# Patient Record
Sex: Male | Born: 1940 | Race: White | Hispanic: No | Smoking: Former smoker
Health system: Southern US, Community
[De-identification: ages and names within clinical notes are randomized; demographics above are authoritative.]

## PROBLEM LIST (undated history)

## (undated) DIAGNOSIS — N179 Acute kidney failure, unspecified: Secondary | ICD-10-CM

## (undated) DIAGNOSIS — I4891 Unspecified atrial fibrillation: Secondary | ICD-10-CM

## (undated) DIAGNOSIS — I499 Cardiac arrhythmia, unspecified: Secondary | ICD-10-CM

## (undated) DIAGNOSIS — N183 Chronic kidney disease, stage 3 unspecified: Secondary | ICD-10-CM

## (undated) DIAGNOSIS — H269 Unspecified cataract: Secondary | ICD-10-CM

## (undated) DIAGNOSIS — I10 Essential (primary) hypertension: Secondary | ICD-10-CM

## (undated) HISTORY — PX: CARDIOVERSION: SHX1299

---
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
## 2016-04-28 ENCOUNTER — Encounter (HOSPITAL_COMMUNITY): Payer: Self-pay | Admitting: *Deleted

## 2016-04-28 ENCOUNTER — Emergency Department (HOSPITAL_COMMUNITY): Payer: Medicare Other

## 2016-04-28 ENCOUNTER — Emergency Department (HOSPITAL_COMMUNITY)
Admission: EM | Admit: 2016-04-28 | Discharge: 2016-04-28 | Disposition: A | Discharge status: Home / Self Care | Payer: Medicare Other | Attending: Emergency Medicine | Admitting: Emergency Medicine

## 2016-04-28 DIAGNOSIS — I480 Paroxysmal atrial fibrillation: Secondary | ICD-10-CM | POA: Diagnosis not present

## 2016-04-28 DIAGNOSIS — R000 Tachycardia, unspecified: Secondary | ICD-10-CM | POA: Diagnosis present

## 2016-04-28 LAB — CBC
HCT: 48.6 % (ref 39.0–52.0)
Hemoglobin: 16.9 g/dL (ref 13.0–17.0)
MCH: 31.9 pg (ref 26.0–34.0)
MCHC: 34.8 g/dL (ref 30.0–36.0)
MCV: 91.9 fL (ref 78.0–100.0)
PLATELETS: 203 10*3/uL (ref 150–400)
RBC: 5.29 MIL/uL (ref 4.22–5.81)
RDW: 12.8 % (ref 11.5–15.5)
WBC: 6.4 10*3/uL (ref 4.0–10.5)

## 2016-04-28 LAB — COMPREHENSIVE METABOLIC PANEL
ALBUMIN: 3.8 g/dL (ref 3.5–5.0)
ALT: 24 U/L (ref 17–63)
ANION GAP: 8 (ref 5–15)
AST: 27 U/L (ref 15–41)
Alkaline Phosphatase: 85 U/L (ref 38–126)
BILIRUBIN TOTAL: 0.9 mg/dL (ref 0.3–1.2)
BUN: 17 mg/dL (ref 6–20)
CHLORIDE: 103 mmol/L (ref 101–111)
CO2: 24 mmol/L (ref 22–32)
Calcium: 8.7 mg/dL — ABNORMAL LOW (ref 8.9–10.3)
Creatinine, Ser: 1.18 mg/dL (ref 0.61–1.24)
GFR calc Af Amer: >60 mL/min (ref >60)
GFR, EST NON AFRICAN AMERICAN: 59 mL/min — AB (ref >60)
Glucose, Bld: 94 mg/dL (ref 65–99)
POTASSIUM: 4.1 mmol/L (ref 3.5–5.1)
Sodium: 135 mmol/L (ref 135–145)
TOTAL PROTEIN: 6.8 g/dL (ref 6.5–8.1)

## 2016-04-28 LAB — TROPONIN I: Troponin I: <0.03 ng/mL

## 2016-04-28 MED ORDER — ETOMIDATE 2 MG/ML IV SOLN
INTRAVENOUS | Status: AC | PRN
  Administered 2016-04-28: 15 mg via INTRAVENOUS

## 2016-04-28 MED ORDER — DILTIAZEM LOAD VIA INFUSION
20.0000 mg | Freq: Once | INTRAVENOUS | Status: AC
  Administered 2016-04-28: 20 mg via INTRAVENOUS
  Filled 2016-04-28: qty 20

## 2016-04-28 MED ORDER — RIVAROXABAN (XARELTO) VTE STARTER PACK (15 & 20 MG)
ORAL_TABLET | ORAL | 0 refills | Status: DC
Start: 2016-04-28 — End: 2016-05-03

## 2016-04-28 MED ORDER — ETOMIDATE 2 MG/ML IV SOLN
15.0000 mg | Freq: Once | INTRAVENOUS | Status: DC
  Filled 2016-04-28: qty 10

## 2016-04-28 MED ORDER — DEXTROSE 5 % IV SOLN
5.0000 mg/h | INTRAVENOUS | Status: DC
  Administered 2016-04-28: 5 mg/h via INTRAVENOUS
  Filled 2016-04-28: qty 100

## 2016-04-28 NOTE — ED Triage Notes (Signed)
Pt arrives from home via [facility]. Pt states he did a lot of work around his yard yesterday and upon going inside felt jittery. Pt states the last time this happened he had an infection around his heart. Pt went to his PCP today rt tachycardia and feeling "off" and they sent him EMS rt EKG changes.

## 2016-04-28 NOTE — ED Provider Notes (Signed)
[facility] Provider Note   CSN: [id number] Arrival date & time: 04/28/16  1026  First Provider Contact:  First MD Initiated Contact with Patient 04/28/16 1038        History   Chief Complaint Chief Complaint  Patient presents with  . Tachycardia    HPI Todd Bright is a 75 y.o. male.  HPI Patient presents emergency department with complaints of elevated heart rate since yesterday.  He first notices elevated heart rate when he was working in the yard but with rest it still did not improve.  He awoke this morning and still felt himself tachycardic to 120 so he went to his primary care doctor who noticed him to be in atrial fibrillation and sent him to the ER.  Patient states she's had one other episode of atrial fibrillation.  He denies chest pain and chest tightness at this time.  No shortness of breath.  No lightheadedness or weakness.  No recent illness.  No fevers or chills.  No productive cough.  Patient's been eating and drinking normally.   History reviewed. No pertinent past medical history.  There are no active problems to display for this patient.   History reviewed. No pertinent surgical history.     Home Medications    Prior to Admission medications   Not on File    Family History History reviewed. No pertinent family history.  Social History Social History  Substance Use Topics  . Smoking status: Unknown If Ever Smoked  . Smokeless tobacco: Not on file  . Alcohol use No     Allergies   Review of patient's allergies indicates no known allergies.   Review of Systems Review of Systems  All other systems reviewed and are negative.    Physical Exam Updated Vital Signs BP (!) 145/104   Pulse (!) 49   Temp 98.2 F (36.8 C) (Oral)   Resp 19   SpO2 98%   Physical Exam  Constitutional: He is oriented to person, place, and time. He appears well-developed and well-nourished.  HENT:  Head: Normocephalic and atraumatic.  Eyes: EOM are  normal.  Neck: Normal range of motion.  Cardiovascular: Intact distal pulses.   Tachycardiac.  Irregularly irregular  Pulmonary/Chest: Effort normal and breath sounds normal. No respiratory distress.  Abdominal: Soft. He exhibits no distension. There is no tenderness.  Musculoskeletal: Normal range of motion.  Neurological: He is alert and oriented to person, place, and time.  Skin: Skin is warm and dry.  Psychiatric: He has a normal mood and affect. Judgment normal.  Nursing note and vitals reviewed.    ED Treatments / Results  Labs (all labs ordered are listed, but only abnormal results are displayed) Labs Reviewed  COMPREHENSIVE METABOLIC PANEL - Abnormal; Notable for the following:       Result Value   Calcium 8.7 (*)    GFR calc non Af Amer 59 (*)    All other components within normal limits  CBC  TROPONIN I    EKG  EKG Interpretation  Date/Time:  Thursday April 28 2016 10:29:47 EDT Ventricular Rate:  124 PR Interval:    QRS Duration: 127 QT Interval:  293 QTC Calculation: 407 R Axis:   75 Text Interpretation:  Atrial fibrillation IVCD, consider atypical RBBB ST elevation suggests acute pericarditis No old tracing to compare Confirmed by Jamail Cullers  MD, Brittany Amirault ([id number]) on 04/28/2016 11:39:30 AM       Radiology No results found.  Procedures .Cardioversion Performed by: Patria Mane,  Rashelle Ireland Authorized by: Azalia Bilis   Consent:    Consent obtained:  Verbal   Consent given by:  Patient and spouse   Risks discussed:  Death, induced arrhythmia and pain   Alternatives discussed:  Rate-control medication and observation Pre-procedure details:    Cardioversion basis:  Elective   Rhythm:  Atrial fibrillation Attempt one:    Cardioversion mode:  Synchronous   Waveform:  Biphasic   Shock (Joules):  150   Shock outcome:  Conversion to normal sinus rhythm Post-procedure details:    Patient status:  Awake   Patient tolerance of procedure:  Tolerated well, no immediate  complications    (including critical care time)  Medications Ordered in ED Medications  diltiazem (CARDIZEM) 1 mg/mL load via infusion 20 mg (20 mg Intravenous Bolus from Bag 04/28/16 1151)    And  diltiazem (CARDIZEM) 100 mg in dextrose 5 % 100 mL (1 mg/mL) infusion (5 mg/hr Intravenous New Bag/Given 04/28/16 1150)  etomidate (AMIDATE) injection 15 mg (not administered)  etomidate (AMIDATE) injection (15 mg Intravenous Given 04/28/16 1432)      Initial Impression / Assessment and Plan / ED Course  I have reviewed the triage vital signs and the nursing notes.  Pertinent labs & imaging results that were available during my care of the patient were reviewed by me and considered in my medical decision making (see chart for details).  Clinical Course   CHADS-VASc = 2 (will initiate anticoagulation)  Attempted chemical cardioversion in the emergency department given onset last night.  Unable to achieve normal sinus rhythm after chemical cardioversion.  Thus synchronized cardioversion performed at the bedside.  Patient returned to normal sinus rhythm.  He is monitored in the emergency department for another hour and will follow-up in the A. fib clinic.  Final Clinical Impressions(s) / ED Diagnoses   Final diagnoses:  Paroxysmal a-fib [facility])    New Prescriptions Discharge Medication List as of 04/28/2016  3:57 PM    START taking these medications   Details  Rivaroxaban 15 & 20 MG TBPK Take as directed on package: Start with one 15mg  tablet by mouth twice a day with food. On Day 22, switch to one 20mg  tablet once a day with food., Print        Azalia Bilis, MD 05/02/16 [phone]

## 2016-04-28 NOTE — Sedation Documentation (Signed)
Pt given 10mg  of etomidate.

## 2016-04-29 ENCOUNTER — Telehealth (HOSPITAL_COMMUNITY): Payer: Self-pay | Admitting: *Deleted

## 2016-04-29 NOTE — Telephone Encounter (Signed)
Appt made for follow up ER visit this morning. Upon reviewing chart patient was sent home with Xarelto starter pack. Patient was instructed to start taking 20mg  once daily instead of the 15mg  twice a day until seen on Tuesday @ 3pm. Patient was very nervous during phone conversation but verbalized understanding to take the 20mg  once a day instead of the 15mg  tablets.  Pt states he feels much better than yesterday but is very nervous about everything. Offered patient appointment today but patient stated he could not come back to [place] today due to traffic. Call back information given and instructed patient to let me know if he had any issues getting RX filled for Xarelto.

## 2016-05-03 ENCOUNTER — Encounter (HOSPITAL_COMMUNITY): Payer: Self-pay | Admitting: Nurse Practitioner

## 2016-05-03 ENCOUNTER — Ambulatory Visit (HOSPITAL_COMMUNITY)
Admission: RE | Admit: 2016-05-03 | Discharge: 2016-05-03 | Disposition: A | Discharge status: Home / Self Care | Payer: Medicare Other | Source: Ambulatory Visit | Attending: Nurse Practitioner | Admitting: Nurse Practitioner

## 2016-05-03 VITALS — BP 172/94 | HR 91 | Ht 72.0 in | Wt 168.8 lb

## 2016-05-03 DIAGNOSIS — Z79899 Other long term (current) drug therapy: Secondary | ICD-10-CM | POA: Diagnosis not present

## 2016-05-03 DIAGNOSIS — I4891 Unspecified atrial fibrillation: Secondary | ICD-10-CM | POA: Diagnosis present

## 2016-05-03 DIAGNOSIS — Z809 Family history of malignant neoplasm, unspecified: Secondary | ICD-10-CM | POA: Insufficient documentation

## 2016-05-03 DIAGNOSIS — I480 Paroxysmal atrial fibrillation: Secondary | ICD-10-CM | POA: Insufficient documentation

## 2016-05-03 DIAGNOSIS — I10 Essential (primary) hypertension: Secondary | ICD-10-CM | POA: Diagnosis not present

## 2016-05-03 DIAGNOSIS — Z7901 Long term (current) use of anticoagulants: Secondary | ICD-10-CM | POA: Diagnosis not present

## 2016-05-03 DIAGNOSIS — Z8249 Family history of ischemic heart disease and other diseases of the circulatory system: Secondary | ICD-10-CM | POA: Diagnosis not present

## 2016-05-03 HISTORY — DX: Essential (primary) hypertension: I10

## 2016-05-03 HISTORY — DX: Cardiac arrhythmia, unspecified: I49.9

## 2016-05-03 MED ORDER — DILTIAZEM HCL 30 MG PO TABS: ORAL_TABLET | ORAL | 3 refills | Status: DC

## 2016-05-03 MED ORDER — RIVAROXABAN 20 MG PO TABS: 20.0000 mg | ORAL_TABLET | Freq: Every day | ORAL | 6 refills | Status: DC

## 2016-05-03 NOTE — Progress Notes (Signed)
Patient ID: Todd Bright, male   DOB: [birth year], 75 y.o.   MRN: [id number]     Primary Care Physician: Sherrie Mustache, MD Referring Physician: ER f/u   Todd Bright is a 75 y.o. male with a h/o new onset afib that presented to the [facility] ER 7/27 for fast heart rate that started the day before when pt was working out in the heat working pulling weeds and cutting down tall grass. He was successfully cardioverted. He also drank a 12 oz Dr. Malachi Bonds that afternoon, which he normally does not drink caffeine. He also quit smoking a week or two before and also had received news of his grandson's death a short time in the last few weeks He was cardioverted in the ER and started on xarelto. He does recall a similar episode in the past but it was not sustained. Chadsvasc score of 3. He states that he has "white coat hypertension" and his BP is normal. He does appear very anxious today. He does not drink alcohol and denies snoring history. No bleeding issues with xarelto on board. He was given xarelto starter kit, but was called at home and placed on correct dose of 20 mg a day. He also states was started on BB in the past by PCP for HTN but "couldn't put one foot in front of another" and it was stopped.  Today, he denies symptoms of palpitations, chest pain, shortness of breath, orthopnea, PND, lower extremity edema, dizziness, presyncope, syncope, or neurologic sequela. The patient is tolerating medications without difficulties and is otherwise without complaint today.   Past Medical History:  Diagnosis Date  . Arrhythmia   . Hypertension    No past surgical history on file.  Current Outpatient Prescriptions  Medication Sig Dispense Refill  . Multiple Vitamin (MULTIVITAMIN WITH MINERALS) TABS tablet Take 1 tablet by mouth daily.    . rivaroxaban (XARELTO) 20 MG TABS tablet Take 1 tablet (20 mg total) by mouth daily with supper. 30 tablet 6  . diltiazem (CARDIZEM) 30 MG tablet Cardizem 22m -- take 1  tablet every 4 hours AS NEEDED for heart rate >100 as long as blood pressure >100. 45 tablet 3   No current facility-administered medications for this encounter.     No Known Allergies  Social History   Social History  . Marital status: Unknown    Spouse name: N/A  . Number of children: N/A  . Years of education: N/A   Occupational History  . Not on file.   Social History Main Topics  . Smoking status: Unknown If Ever Smoked  . Smokeless tobacco: Not on file  . Alcohol use No  . Drug use: No  . Sexual activity: Not on file   Other Topics Concern  . Not on file   Social History Narrative  . No narrative on file    Family History  Problem Relation Age of Onset  . Heart attack Mother   . Emphysema Father   . Cancer Sister     ROS- All systems are reviewed and negative except as per the HPI above  Physical Exam: Vitals:   05/03/16 1412  BP: (!) 172/94  Pulse: 91  Weight: 168 lb 12.8 oz (76.6 kg)  Height: 6' (1.829 m)    GEN- The patient is well appearing, alert and oriented x 3 today.   Head- normocephalic, atraumatic Eyes-  Sclera clear, conjunctiva pink Ears- hearing intact Oropharynx- clear Neck- supple, no JVP Lymph- no cervical lymphadenopathy  Lungs- Clear to ausculation bilaterally, normal work of breathing Heart- Regular rate and rhythm, no murmurs, rubs or gallops, PMI not laterally displaced GI- soft, NT, ND, + BS Extremities- no clubbing, cyanosis, or edema MS- no significant deformity or atrophy Skin- no rash or lesion Psych- euthymic mood, full affect Neuro- strength and sensation are intact  EKG- NSR at 91 bpm, pr int 142 ms, qrs int 84 ms, qtc 437 ms Epic records reviewed  Assessment and Plan: 1. PAF Successful cardioversion with chadsvasc score of at least 3 Continue xarelto without interruption for at least 30 days but will require long term Bleeding precautions given General education re afib and management discussed and written  info given Does not want daily med to help prevent afib Was given rx for cardizem 30 mg to use as needed every 4 hours for occurrence of afib Echo  F/u in afib clinic in 2-3 weeks  [name], [facility] [address] [place], [place] [address] [phone]

## 2016-05-03 NOTE — Patient Instructions (Signed)
Your physician has recommended you make the following change in your medication:  1)Cardizem 30mg  -- take 1 tablet every 4 hours AS NEEDED for fast heart rate. 2)Stop aspirin

## 2016-05-12 ENCOUNTER — Ambulatory Visit (HOSPITAL_COMMUNITY)
Admission: RE | Admit: 2016-05-12 | Discharge: 2016-05-12 | Disposition: A | Discharge status: Home / Self Care | Payer: Medicare Other | Source: Ambulatory Visit | Attending: Nurse Practitioner | Admitting: Nurse Practitioner

## 2016-05-12 DIAGNOSIS — I480 Paroxysmal atrial fibrillation: Secondary | ICD-10-CM | POA: Diagnosis not present

## 2016-05-12 DIAGNOSIS — I10 Essential (primary) hypertension: Secondary | ICD-10-CM | POA: Diagnosis not present

## 2016-05-12 DIAGNOSIS — I4891 Unspecified atrial fibrillation: Secondary | ICD-10-CM | POA: Diagnosis present

## 2016-05-12 NOTE — Progress Notes (Signed)
  Echocardiogram 2D Echocardiogram has been performed.  Todd Bright, Todd Bright 05/12/2016, 11:51 AM

## 2016-05-17 ENCOUNTER — Other Ambulatory Visit (HOSPITAL_COMMUNITY): Payer: Self-pay | Admitting: *Deleted

## 2016-05-20 ENCOUNTER — Encounter (HOSPITAL_COMMUNITY): Payer: Self-pay | Admitting: *Deleted

## 2016-05-20 ENCOUNTER — Emergency Department (HOSPITAL_COMMUNITY)
Admission: EM | Admit: 2016-05-20 | Discharge: 2016-05-20 | Disposition: A | Discharge status: Home / Self Care | Payer: Medicare Other | Attending: Emergency Medicine | Admitting: Emergency Medicine

## 2016-05-20 DIAGNOSIS — I4891 Unspecified atrial fibrillation: Secondary | ICD-10-CM | POA: Diagnosis not present

## 2016-05-20 DIAGNOSIS — I10 Essential (primary) hypertension: Secondary | ICD-10-CM | POA: Diagnosis not present

## 2016-05-20 DIAGNOSIS — Z7901 Long term (current) use of anticoagulants: Secondary | ICD-10-CM | POA: Insufficient documentation

## 2016-05-20 DIAGNOSIS — Z87891 Personal history of nicotine dependence: Secondary | ICD-10-CM | POA: Insufficient documentation

## 2016-05-20 HISTORY — DX: Unspecified cataract: H26.9

## 2016-05-20 LAB — CBC WITH DIFFERENTIAL/PLATELET
BASOS PCT: 1 %
Basophils Absolute: 0.1 10*3/uL (ref 0.0–0.1)
EOS ABS: 0.1 10*3/uL (ref 0.0–0.7)
EOS PCT: 2 %
HEMATOCRIT: 48.5 % (ref 39.0–52.0)
Hemoglobin: 16.8 g/dL (ref 13.0–17.0)
Lymphocytes Relative: 32 %
Lymphs Abs: 1.7 10*3/uL (ref 0.7–4.0)
MCH: 31.9 pg (ref 26.0–34.0)
MCHC: 34.6 g/dL (ref 30.0–36.0)
MCV: 92 fL (ref 78.0–100.0)
MONO ABS: 0.4 10*3/uL (ref 0.1–1.0)
MONOS PCT: 8 %
Neutro Abs: 3 10*3/uL (ref 1.7–7.7)
Neutrophils Relative %: 57 %
Platelets: 191 10*3/uL (ref 150–400)
RBC: 5.27 MIL/uL (ref 4.22–5.81)
RDW: 12.7 % (ref 11.5–15.5)
WBC: 5.2 10*3/uL (ref 4.0–10.5)

## 2016-05-20 LAB — COMPREHENSIVE METABOLIC PANEL
ALBUMIN: 3.9 g/dL (ref 3.5–5.0)
ALT: 23 U/L (ref 17–63)
ANION GAP: 6 (ref 5–15)
AST: 26 U/L (ref 15–41)
Alkaline Phosphatase: 82 U/L (ref 38–126)
BILIRUBIN TOTAL: 1 mg/dL (ref 0.3–1.2)
BUN: 16 mg/dL (ref 6–20)
CO2: 24 mmol/L (ref 22–32)
Calcium: 8.9 mg/dL (ref 8.9–10.3)
Chloride: 106 mmol/L (ref 101–111)
Creatinine, Ser: 1.13 mg/dL (ref 0.61–1.24)
GFR calc Af Amer: >60 mL/min (ref >60)
GFR calc non Af Amer: >60 mL/min (ref >60)
GLUCOSE: 113 mg/dL — AB (ref 65–99)
POTASSIUM: 4.2 mmol/L (ref 3.5–5.1)
Sodium: 136 mmol/L (ref 135–145)
TOTAL PROTEIN: 6.8 g/dL (ref 6.5–8.1)

## 2016-05-20 LAB — I-STAT TROPONIN, ED: TROPONIN I, POC: 0.02 ng/mL (ref 0.00–0.08)

## 2016-05-20 MED ORDER — DILTIAZEM HCL 25 MG/5ML IV SOLN
10.0000 mg | Freq: Once | INTRAVENOUS | Status: AC
  Administered 2016-05-20: 10 mg via INTRAVENOUS
  Filled 2016-05-20: qty 5

## 2016-05-20 MED ORDER — LORAZEPAM 2 MG/ML IJ SOLN
0.5000 mg | Freq: Once | INTRAMUSCULAR | Status: AC
  Administered 2016-05-20: 0.5 mg via INTRAVENOUS
  Filled 2016-05-20: qty 1

## 2016-05-20 MED ORDER — DILTIAZEM HCL ER COATED BEADS 120 MG PO CP24
120.0000 mg | ORAL_CAPSULE | Freq: Every day | ORAL | 0 refills | Status: DC
Start: 2016-05-20 — End: 2016-05-26

## 2016-05-20 NOTE — ED Triage Notes (Signed)
Pt states cardioverted for afib 3 weeks ago.  States [employer]owed lawn Wed and since then HR has been elevated (approx 135).  Denies pain or sob.

## 2016-05-20 NOTE — ED Provider Notes (Signed)
[facility] Provider Note   CSN: [id number] Arrival date & time: 05/20/16  [id number]     History   Chief Complaint Chief Complaint  Patient presents with  . Atrial Fibrillation    HPI Todd Bright is a 75 y.o. male.  HPI Patient presents with acute onset tachycardia that started yesterday while mowing the grass. Denies any chest pain or shortness of breath. States he feels jittery. Patient has history of proximal atrial fibrillation. He is on Xarelto. Was seen in A. fib clinic on 8/10. Any recent fever or chills. No new lower sugary swelling. Past Medical History:  Diagnosis Date  . Arrhythmia   . Cataracts, both eyes   . Hypertension     There are no active problems to display for this patient.   Past Surgical History:  Procedure Laterality Date  . CARDIOVERSION         Home Medications    Prior to Admission medications   Medication Sig Start Date End Date Taking? Authorizing Provider  Multiple Vitamin (MULTIVITAMIN WITH MINERALS) TABS tablet Take 1 tablet by mouth daily.   Yes Historical Provider, MD  Multiple Vitamins-Minerals (EYE VITAMINS & MINERALS PO) Take 1 tablet by mouth daily.   Yes Historical Provider, MD  Omega-3 Fatty Acids (FISH OIL) 1000 MG CPDR Take 1,000 mg by mouth daily.   Yes Historical Provider, MD  rivaroxaban (XARELTO) 20 MG TABS tablet Take 1 tablet (20 mg total) by mouth daily with supper. 05/03/16  Yes Newman Niponna C Carroll, NP  simvastatin (ZOCOR) 40 MG tablet Take 40 mg by mouth daily.   Yes Historical Provider, MD  diltiazem (CARDIZEM CD) 120 MG 24 hr capsule Take 1 capsule (120 mg total) by mouth daily. 05/20/16   Loren Raceravid Delanie Tirrell, MD    Family History Family History  Problem Relation Age of Onset  . Heart attack Mother   . Emphysema Father   . Cancer Sister     Social History Social History  Substance Use Topics  . Smoking status: Former Smoker    Quit date: 05/21/2015  . Smokeless tobacco: Not on file  . Alcohol use No      Allergies   Review of patient's allergies indicates no known allergies.   Review of Systems Review of Systems  Constitutional: Negative for chills and fever.  Eyes: Negative for visual disturbance.  Respiratory: Negative for chest tightness and shortness of breath.   Cardiovascular: Positive for palpitations. Negative for chest pain and leg swelling.  Gastrointestinal: Negative for abdominal pain, diarrhea, nausea and vomiting.  Musculoskeletal: Negative for back pain, myalgias, neck pain and neck stiffness.  Neurological: Negative for dizziness, weakness, light-headedness, numbness and headaches.  All other systems reviewed and are negative.    Physical Exam Updated Vital Signs BP 139/81   Pulse 69   Temp 98 F (36.7 C) (Oral)   Resp 19   Ht 6' (1.829 m)   Wt 170 lb (77.1 kg)   SpO2 99%   BMI 23.06 kg/m   Physical Exam  Constitutional: He is oriented to person, place, and time. He appears well-developed and well-nourished.  Anxious appearing  HENT:  Head: Normocephalic and atraumatic.  Mouth/Throat: Oropharynx is clear and moist.  Eyes: EOM are normal. Pupils are equal, round, and reactive to light.  Neck: Normal range of motion. Neck supple. No JVD present.  Cardiovascular: Exam reveals no gallop and no friction rub.   No murmur heard. Tachycardia. Irregularly irregular.  Pulmonary/Chest: Effort normal and breath sounds  normal. No respiratory distress. He has no wheezes. He has no rales. He exhibits no tenderness.  Abdominal: Soft. Bowel sounds are normal. There is no tenderness. There is no rebound and no guarding.  Musculoskeletal: Normal range of motion. He exhibits no edema or tenderness.  No lower extremity swelling, asymmetry or tenderness. Distal pulses are equal and 2+.  Neurological: He is alert and oriented to person, place, and time.  5/5 motor in all extremities. Sensation fully intact.  Skin: Skin is warm and dry. No rash noted. No erythema.   Psychiatric: His behavior is normal.  Nursing note and vitals reviewed.    ED Treatments / Results  Labs (all labs ordered are listed, but only abnormal results are displayed) Labs Reviewed  COMPREHENSIVE METABOLIC PANEL - Abnormal; Notable for the following:       Result Value   Glucose, Bld 113 (*)    All other components within normal limits  CBC WITH DIFFERENTIAL/PLATELET  I-STAT TROPOININ, ED    EKG  EKG Interpretation  Date/Time:  Friday May 20 2016 09:01:34 EDT Ventricular Rate:  129 PR Interval:    QRS Duration: 89 QT Interval:  310 QTC Calculation: 433 R Axis:   78 Text Interpretation:  Atrial fibrillation Abnormal R-wave progression, early transition Baseline wander in lead(s) III Confirmed by Ranae PalmsYELVERTON  MD, Denay Pleitez ([id number]) on 05/20/2016 9:47:34 AM       Radiology No results found.  Procedures Procedures (including critical care time)  Medications Ordered in ED Medications  diltiazem (CARDIZEM) injection 10 mg (10 mg Intravenous Given 05/20/16 1023)  LORazepam (ATIVAN) injection 0.5 mg (0.5 mg Intravenous Given 05/20/16 1023)     Initial Impression / Assessment and Plan / ED Course  I have reviewed the triage vital signs and the nursing notes.  Pertinent labs & imaging results that were available during my care of the patient were reviewed by me and considered in my medical decision making (see chart for details).  Clinical Course   The patient rate controlled after 10 mg of IV Cardizem. Continues to deny any shortness of breath chest pain, lightheadedness. He is anticoagulated. Discussed with Dr. Mayford Knifeurner. Recommends starting Cardizem CD 120 mg daily and follow-up in afib clinic next week. Patient understands return precautions.   Final Clinical Impressions(s) / ED Diagnoses   Final diagnoses:  Atrial fibrillation with RVR (HCC)    New Prescriptions New Prescriptions   DILTIAZEM (CARDIZEM CD) 120 MG 24 HR CAPSULE    Take 1 capsule (120 mg total)  by mouth daily.     Loren Raceravid Binh Doten, MD 05/20/16 1235

## 2016-05-23 ENCOUNTER — Emergency Department (HOSPITAL_COMMUNITY): Payer: Medicare Other

## 2016-05-23 ENCOUNTER — Emergency Department (HOSPITAL_COMMUNITY)
Admission: EM | Admit: 2016-05-23 | Discharge: 2016-05-23 | Disposition: A | Discharge status: Home / Self Care | Payer: Medicare Other | Attending: Emergency Medicine | Admitting: Emergency Medicine

## 2016-05-23 ENCOUNTER — Encounter (HOSPITAL_COMMUNITY): Payer: Self-pay | Admitting: *Deleted

## 2016-05-23 DIAGNOSIS — I10 Essential (primary) hypertension: Secondary | ICD-10-CM | POA: Diagnosis not present

## 2016-05-23 DIAGNOSIS — I4891 Unspecified atrial fibrillation: Secondary | ICD-10-CM | POA: Diagnosis present

## 2016-05-23 DIAGNOSIS — Z87891 Personal history of nicotine dependence: Secondary | ICD-10-CM | POA: Diagnosis not present

## 2016-05-23 DIAGNOSIS — Z8679 Personal history of other diseases of the circulatory system: Secondary | ICD-10-CM

## 2016-05-23 DIAGNOSIS — Z7901 Long term (current) use of anticoagulants: Secondary | ICD-10-CM | POA: Diagnosis not present

## 2016-05-23 DIAGNOSIS — Z79899 Other long term (current) drug therapy: Secondary | ICD-10-CM | POA: Diagnosis not present

## 2016-05-23 LAB — CBC
HEMATOCRIT: 53.1 % — AB (ref 39.0–52.0)
Hemoglobin: 18.5 g/dL — ABNORMAL HIGH (ref 13.0–17.0)
MCH: 32.2 pg (ref 26.0–34.0)
MCHC: 34.8 g/dL (ref 30.0–36.0)
MCV: 92.3 fL (ref 78.0–100.0)
PLATELETS: 203 10*3/uL (ref 150–400)
RBC: 5.75 MIL/uL (ref 4.22–5.81)
RDW: 12.5 % (ref 11.5–15.5)
WBC: 6.7 10*3/uL (ref 4.0–10.5)

## 2016-05-23 LAB — BASIC METABOLIC PANEL
Anion gap: 9 (ref 5–15)
BUN: 10 mg/dL (ref 6–20)
CO2: 26 mmol/L (ref 22–32)
CREATININE: 1.32 mg/dL — AB (ref 0.61–1.24)
Calcium: 9.1 mg/dL (ref 8.9–10.3)
Chloride: 102 mmol/L (ref 101–111)
GFR calc Af Amer: 59 mL/min — ABNORMAL LOW (ref >60)
GFR, EST NON AFRICAN AMERICAN: 51 mL/min — AB (ref >60)
GLUCOSE: 143 mg/dL — AB (ref 65–99)
POTASSIUM: 3.9 mmol/L (ref 3.5–5.1)
SODIUM: 137 mmol/L (ref 135–145)

## 2016-05-23 MED ORDER — SODIUM CHLORIDE 0.9 % IV SOLN
250.0000 mL | INTRAVENOUS | Status: DC
  Administered 2016-05-23: 250 mL via INTRAVENOUS

## 2016-05-23 MED ORDER — SODIUM CHLORIDE 0.9 % IV BOLUS (SEPSIS)
500.0000 mL | Freq: Once | INTRAVENOUS | Status: AC
  Administered 2016-05-23: 09:00:00 via INTRAVENOUS

## 2016-05-23 MED ORDER — SODIUM CHLORIDE 0.9% FLUSH: 3.0000 mL | INTRAVENOUS | Status: DC | PRN

## 2016-05-23 MED ORDER — SODIUM CHLORIDE 0.9% FLUSH: 3.0000 mL | Freq: Two times a day (BID) | INTRAVENOUS | Status: DC

## 2016-05-23 MED ORDER — ETOMIDATE 2 MG/ML IV SOLN
15.0000 mg | Freq: Once | INTRAVENOUS | Status: AC
  Administered 2016-05-23: 15 mg via INTRAVENOUS
  Filled 2016-05-23: qty 10

## 2016-05-23 NOTE — ED Provider Notes (Addendum)
[facility]-EMERGENCY DEPT Provider Note   CSN: [id number] Arrival date & time: 05/23/16  0747     History   Chief Complaint Chief Complaint  Patient presents with  . Hypertension  . Atrial Fibrillation    HPI Todd Bright is a 75 y.o. male.  He presents for evaluation of palpitations with fast heart rate, which started several days ago. She was evaluated here, 05/20/16, for the same problem, told to start taking Cardizem CD, 120 mg daily. He took his first dose, yesterday evening. Before that he was on Cardizem 30 mg 4 times a day when necessary for fast heartbeat. He was cardioverted in the emergency department about 3 weeks ago. He had a follow-up in the atrial fibrillation, clinic, after that. He has not yet seen cardiology, for consultation. He denies fever, chills, cough, shortness of breath, chest pain, weakness or dizziness.  HPI  Past Medical History:  Diagnosis Date  . Arrhythmia   . Cataracts, both eyes   . Hypertension     There are no active problems to display for this patient.   Past Surgical History:  Procedure Laterality Date  . CARDIOVERSION         Home Medications    Prior to Admission medications   Medication Sig Start Date End Date Taking? Authorizing Provider  diltiazem (CARDIZEM CD) 120 MG 24 hr capsule Take 1 capsule (120 mg total) by mouth daily. 05/20/16   Loren Raceravid Yelverton, MD  Multiple Vitamin (MULTIVITAMIN WITH MINERALS) TABS tablet Take 1 tablet by mouth daily.    Historical Provider, MD  Multiple Vitamins-Minerals (EYE VITAMINS & MINERALS PO) Take 1 tablet by mouth daily.    Historical Provider, MD  Omega-3 Fatty Acids (FISH OIL) 1000 MG CPDR Take 1,000 mg by mouth daily.    Historical Provider, MD  rivaroxaban (XARELTO) 20 MG TABS tablet Take 1 tablet (20 mg total) by mouth daily with supper. 05/03/16   Newman Niponna C Carroll, NP  simvastatin (ZOCOR) 40 MG tablet Take 40 mg by mouth daily.    Historical Provider, MD    Family History Family History    Problem Relation Age of Onset  . Heart attack Mother   . Emphysema Father   . Cancer Sister     Social History Social History  Substance Use Topics  . Smoking status: Former Smoker    Quit date: 05/21/2015  . Smokeless tobacco: Not on file  . Alcohol use No     Allergies   Review of patient's allergies indicates no known allergies.   Review of Systems Review of Systems  All other systems reviewed and are negative.    Physical Exam Updated Vital Signs BP (!) 143/121 (BP Location: Right Arm)   Pulse 64   Temp 98.3 F (36.8 C) (Oral)   Resp 22   Physical Exam  Constitutional: He is oriented to person, place, and time. He appears well-developed and well-nourished.  HENT:  Head: Normocephalic and atraumatic.  Right Ear: External ear normal.  Left Ear: External ear normal.  Eyes: Conjunctivae and EOM are normal. Pupils are equal, round, and reactive to light.  Neck: Normal range of motion and phonation normal. Neck supple.  Cardiovascular: Normal heart sounds.   Irregular tachycardia  Pulmonary/Chest: Effort normal and breath sounds normal. He exhibits no bony tenderness.  Abdominal: Soft. There is no tenderness.  Musculoskeletal: Normal range of motion.  Neurological: He is alert and oriented to person, place, and time. No cranial nerve deficit or sensory deficit.  He exhibits normal muscle tone. Coordination normal.  Skin: Skin is warm, dry and intact.  Psychiatric: He has a normal mood and affect. His behavior is normal. Judgment and thought content normal.  Nursing note and vitals reviewed.    ED Treatments / Results  Labs (all labs ordered are listed, but only abnormal results are displayed) Labs Reviewed  CBC - Abnormal; Notable for the following:       Result Value   Hemoglobin 18.5 (*)    HCT 53.1 (*)    All other components within normal limits  BASIC METABOLIC PANEL    EKG  EKG Interpretation  Date/Time:  Monday May 23 2016 07:52:09  EDT Ventricular Rate:  135 PR Interval:    QRS Duration: 86 QT Interval:  260 QTC Calculation: 390 R Axis:   75 Text Interpretation:  Atrial fibrillation with rapid ventricular response Nonspecific ST abnormality Abnormal ECG since last tracing no significant change Confirmed by Effie Shy  MD, Camarie Mctigue [phone]) on 05/23/2016 10:50:11 AM        EKG Interpretation  Date/Time:  Monday May 23 2016 10:34:54 EDT Ventricular Rate:  74 PR Interval:    QRS Duration: 89 QT Interval:  369 QTC Calculation: 410 R Axis:   56 Text Interpretation:  Sinus rhythm Abnormal R-wave progression, early transition Since last tracing converted to NSR Confirmed by [facility]  MD, Kinzly Pierrelouis ([id number]) on 05/23/2016 10:51:27 AM           Radiology No results found.  Procedures .Cardioversion Date/Time: 05/23/2016 10:31 AM Performed by: Mancel Bale Authorized by: Mancel Bale   Consent:    Consent obtained:  Written   Consent given by:  Patient   Risks discussed:  Cutaneous burn and induced arrhythmia   Alternatives discussed:  No treatment and rate-control medication Pre-procedure details:    Cardioversion basis:  Elective   Rhythm:  Atrial fibrillation   Electrode placement:  Anterior-posterior Attempt one:    Cardioversion mode:  Synchronous   Waveform:  Biphasic   Shock (Joules):  150   Shock outcome:  Conversion to normal sinus rhythm Post-procedure details:    Patient status:  Awake   Patient tolerance of procedure:  Tolerated well, no immediate complications .Sedation Date/Time: 05/23/2016 10:44 AM Performed by: Mancel Bale Authorized by: Mancel Bale   Consent:    Consent obtained:  Written   Consent given by:  Patient   Risks discussed:  Inadequate sedation, vomiting and prolonged hypoxia resulting in organ damage Indications:    Procedure performed:  Cardioversion   Procedure necessitating sedation performed by:  Physician performing sedation   Intended level of sedation:   Deep Pre-sedation assessment:    Time since last food or drink:  4 hours   ASA classification: class 2 - patient with mild systemic disease     Mouth opening:  2 finger widths   Mallampati score:  II - soft palate, uvula, fauces visible   Pre-sedation assessments completed and reviewed: airway patency, cardiovascular function and hydration status     Pre-sedation assessments completed and reviewed: nausea/vomiting not reviewed and pain level not reviewed     History of difficult intubation: no     Pre-sedation assessment completed:  05/23/2016 10:20 AM Immediate pre-procedure details:    Reassessment: Patient reassessed immediately prior to procedure     Reviewed: vital signs     Verified: bag valve mask available, emergency equipment available, IV patency confirmed and oxygen available   Procedure details (see MAR for exact dosages):  Sedation start time:  05/23/2016 10:25 AM   Preoxygenation:  Nasal cannula   Sedation:  Etomidate   Intra-procedure monitoring:  Blood pressure monitoring, cardiac monitor, continuous capnometry and continuous pulse oximetry   Intra-procedure events: none     Sedation end time:  05/23/2016 10:45 AM Post-procedure details:    Post-sedation assessment completed:  05/23/2016 10:47 AM   Attendance: Constant attendance by certified staff until patient recovered     Recovery: Patient returned to pre-procedure baseline     Estimated blood loss (see I/O flowsheets): no     Complications:  Perodic apnea while sedated, spontaneous resolution   Post-sedation assessments completed and reviewed: airway patency, cardiovascular function, hydration status and mental status     Post-sedation assessments completed and reviewed: nausea/vomiting not reviewed and pain level not reviewed     Specimens recovered:  None   Patient is stable for discharge or admission: yes     Patient tolerance:  Tolerated well, no immediate complications   (including critical care  time)  Medications Ordered in ED Medications - No data to display   Initial Impression / Assessment and Plan / ED Course  I have reviewed the triage vital signs and the nursing notes.  Pertinent labs & imaging results that were available during my care of the patient were reviewed by me and considered in my medical decision making (see chart for details).  Clinical Course  Value Comment By Time  Creatinine: (!) 1.32 Mild elevation; IV fluids ordered. Mancel Bale, MD 08/21 1052   This patients CHA2DS2-VASc Score and unadjusted Ischemic Stroke Rate (% per year) is equal to 2.2 % stroke rate/year from a score of 2 [HTN]  Above score calculated as 1 point each if present [CHF, HTN, DM, Vascular=MI/PAD/Aortic Plaque, Age if 65-74, or Male] Above score calculated as 2 points each if present [Age > 75, or Stroke/TIA/TE]  09:10- case discussed with cardiology, Dr.Nahser; who agrees with cardioversion, in the emergency department.    Medications  sodium chloride flush (NS) 0.9 % injection 3 mL (not administered)  sodium chloride flush (NS) 0.9 % injection 3 mL (not administered)  0.9 %  sodium chloride infusion (not administered)  sodium chloride 0.9 % bolus 500 mL ( Intravenous New Bag/Given 05/23/16 0905)  etomidate (AMIDATE) injection 15 mg (15 mg Intravenous Given 05/23/16 1030)    Patient Vitals for the past 24 hrs:  BP Temp Temp src Pulse Resp SpO2  05/23/16 1043 (!) 176/105 - - 83 12 100 %  05/23/16 1039 (!) 158/105 - - 75 18 99 %  05/23/16 1033 (!) 171/101 - - 80 12 100 %  05/23/16 1029 (!) 155/104 - - 86 20 100 %  05/23/16 1015 (!) 155/104 - - 95 19 99 %  05/23/16 1000 (!) 138/105 - - 94 20 100 %  05/23/16 0945 (!) 152/101 - - 84 21 100 %  05/23/16 0915 134/92 - - 87 16 99 %  05/23/16 0900 123/83 - - 85 19 99 %  05/23/16 0753 (!) 143/121 98.3 F (36.8 C) Oral 64 22 -    11:07 AM Reevaluation with update and discussion. After initial assessment and treatment, an  updated evaluation reveals He is alert and comfortable and remains in sinus rhythm at this time. Findings discussed with the patient and all questions were answered. Jeryn Bertoni L    CRITICAL CARE Performed by: Flint Melter Total critical care time: 55 minutes Critical care time was exclusive of separately billable procedures and treating  other patients. Critical care was necessary to treat or prevent imminent or life-threatening deterioration. Critical care was time spent personally by me on the following activities: development of treatment plan with patient and/or surrogate as well as nursing, discussions with consultants, evaluation of patient's response to treatment, examination of patient, obtaining history from patient or surrogate, ordering and performing treatments and interventions, ordering and review of laboratory studies, ordering and review of radiographic studies, pulse oximetry and re-evaluation of patient's condition.    Final Clinical Impressions(s) / ED Diagnoses   Final diagnoses:  Atrial fibrillation with RVR (HCC)  Atrial fibrillation status post cardioversion [facility](HCC)  Atrial fibrillation, currently in sinus rhythm (HCC)    He presented with uncontrolled atrial fibrillation, and was cardioverted successfully. He is currently taking xarelto appropriately at 20 mg each day. He has scheduled follow-up with the atrial fibrillation, clinic, this week. Doubt ACS, PE, pneumonia, or metabolic instability.  Nursing Notes Reviewed/ Care Coordinated Applicable Imaging Reviewed Interpretation of Laboratory Data incorporated into ED treatment  The patient appears reasonably screened and/or stabilized for discharge and I doubt any other medical condition or other [facility] requiring further screening, evaluation, or treatment in the ED at this time prior to discharge.  Plan: Home Medications- continue; Home Treatments- avoid dehydration; return here if the recommended treatment, does  not improve the symptoms; Recommended follow up- PCP as needed and cardiology as scheduled   New Prescriptions New Prescriptions   No medications on file     Mancel BaleElliott Keone Kamer, MD 05/23/16 1109    Mancel BaleElliott Findlay Dagher, MD 05/23/16 1205

## 2016-05-23 NOTE — ED Triage Notes (Signed)
Pt reports being here today due to tachycardia and htn. Hx of atrial fib with recent cardioversion. No acute distress noted at this time.

## 2016-05-23 NOTE — Discharge Instructions (Signed)
Continue taking your current medicines as prescribed.   Avoid exertion, until you follow-up with the heart care provider.  Drink several glasses of water each day to keep yourself well hydrated.

## 2016-05-23 NOTE — Progress Notes (Signed)
RT on standby at bedside for cardioversion. Pt on 4L [place] and stable throughout with no complications. Pt did have a few episodes of brief apnea but spo2 never less than 100%. ETCO2 monitoring throughout procedure. RT will continue to monitor

## 2016-05-26 ENCOUNTER — Encounter (HOSPITAL_COMMUNITY): Payer: Self-pay | Admitting: Nurse Practitioner

## 2016-05-26 ENCOUNTER — Ambulatory Visit (HOSPITAL_COMMUNITY)
Admission: RE | Admit: 2016-05-26 | Discharge: 2016-05-26 | Disposition: A | Discharge status: Home / Self Care | Payer: Medicare Other | Source: Ambulatory Visit | Attending: Nurse Practitioner | Admitting: Nurse Practitioner

## 2016-05-26 VITALS — BP 150/90 | HR 91 | Ht 72.0 in | Wt 168.2 lb

## 2016-05-26 DIAGNOSIS — Z79899 Other long term (current) drug therapy: Secondary | ICD-10-CM | POA: Diagnosis not present

## 2016-05-26 DIAGNOSIS — I10 Essential (primary) hypertension: Secondary | ICD-10-CM | POA: Insufficient documentation

## 2016-05-26 DIAGNOSIS — Z87891 Personal history of nicotine dependence: Secondary | ICD-10-CM | POA: Insufficient documentation

## 2016-05-26 DIAGNOSIS — Z7901 Long term (current) use of anticoagulants: Secondary | ICD-10-CM | POA: Insufficient documentation

## 2016-05-26 DIAGNOSIS — Z8249 Family history of ischemic heart disease and other diseases of the circulatory system: Secondary | ICD-10-CM | POA: Insufficient documentation

## 2016-05-26 DIAGNOSIS — I480 Paroxysmal atrial fibrillation: Secondary | ICD-10-CM | POA: Insufficient documentation

## 2016-05-26 DIAGNOSIS — I4891 Unspecified atrial fibrillation: Secondary | ICD-10-CM

## 2016-05-26 DIAGNOSIS — Z825 Family history of asthma and other chronic lower respiratory diseases: Secondary | ICD-10-CM | POA: Insufficient documentation

## 2016-05-26 MED ORDER — DILTIAZEM HCL 30 MG PO TABS: 30.0000 mg | ORAL_TABLET | ORAL | 0 refills | Status: DC | PRN

## 2016-05-26 MED ORDER — RIVAROXABAN 20 MG PO TABS: 20.0000 mg | ORAL_TABLET | Freq: Every day | ORAL | 1 refills | Status: DC

## 2016-05-26 MED ORDER — DILTIAZEM HCL ER COATED BEADS 120 MG PO CP24: 120.0000 mg | ORAL_CAPSULE | Freq: Every day | ORAL | 3 refills | Status: DC

## 2016-05-26 MED ORDER — SIMVASTATIN 10 MG PO TABS: 10.0000 mg | ORAL_TABLET | Freq: Every day | ORAL | 3 refills | Status: DC

## 2016-05-26 NOTE — Patient Instructions (Signed)
Your physician has recommended you make the following change in your medication:  Decrease your Simvastatin to 10 mg daily.  This has been sent to your pharmacy.  With your Cardizem 30 mg; Take one tablet by mouth every 4 hours AS NEEDED for A-fib HR > 100 as long as BP > 100.  All of your medications have been sent in for refill to your pharmacy

## 2016-05-26 NOTE — Progress Notes (Signed)
Patient ID: Todd Bright, male   DOB: [birth year], 75 y.o.   MRN: [id number]     Primary Care Physician: Sherrie Mustache, MD Referring Physician: ER f/u   Todd Bright is a 75 y.o. male with a h/o new onset afib that presented to the [facility] ER 7/27 for fast heart rate that started the day before when pt was working out in the heat working pulling weeds and cutting down tall grass. He was successfully cardioverted. He also drank a 12 oz Dr. Malachi Bonds that afternoon, which he normally does not drink caffeine. He also quit smoking a week or two before and also had received news of his grandson's death a short time in the last few weeks He was cardioverted in the ER and started on xarelto. He does recall a similar episode in the past but it was not sustained. Chadsvasc score of 3. He states that he has "white coat hypertension" and his BP is normal. He does appear very anxious today. He does not drink alcohol and denies snoring history. No bleeding issues with xarelto on board. He was given xarelto starter kit, but was called at home and placed on correct dose of 20 mg a day. He also states was started on BB in the past by PCP for HTN but "couldn't put one foot in front of another" and it was stopped.  He returns to clinic today after being in the ER two additional times for afib. One time cardizem drip converted pat and  8/27, he was cardioverted. We again discussed when it was appropriate to go to the Er for treatment and how to manage at home.  This was discussed on previous visit, but pt gets so anxious, I think he only comprehends a small amount of what I say. He is so nervous this am that his hands are shaking and his voice is quivering and comes close to tears at times.His PCP gave him rx for ativan, which encouraged to take when he gets this anxious. Repeated instructions how to handle Afib at home. Echo done and revealed structurally normal heart.  Today, he denies symptoms of palpitations, chest  pain, shortness of breath, orthopnea, PND, lower extremity edema, dizziness, presyncope, syncope, or neurologic sequela. Positive for extreme nervousness. The patient is tolerating medications without difficulties and is otherwise without complaint today.   Past Medical History:  Diagnosis Date  . Arrhythmia   . Cataracts, both eyes   . Hypertension    Past Surgical History:  Procedure Laterality Date  . CARDIOVERSION      Current Outpatient Prescriptions  Medication Sig Dispense Refill  . diltiazem (CARDIZEM CD) 120 MG 24 hr capsule Take 1 capsule (120 mg total) by mouth daily. 90 capsule 3  . LORazepam (ATIVAN) 0.5 MG tablet Take 0.5 mg by mouth as needed for anxiety.    . Multiple Vitamin (MULTIVITAMIN WITH MINERALS) TABS tablet Take 1 tablet by mouth daily.    . Multiple Vitamins-Minerals (EYE VITAMINS & MINERALS PO) Take 1 tablet by mouth daily.    . Omega-3 Fatty Acids (FISH OIL) 1000 MG CPDR Take 1,000 mg by mouth daily.    . rivaroxaban (XARELTO) 20 MG TABS tablet Take 1 tablet (20 mg total) by mouth daily with supper. 90 tablet 1  . diltiazem (CARDIZEM) 30 MG tablet Take 1 tablet (30 mg total) by mouth as needed. Take one tablet by mouth every 4 hours AS NEEDED for A-fib HR > 100 as long as BP >  100. 90 tablet 0  . simvastatin (ZOCOR) 10 MG tablet Take 1 tablet (10 mg total) by mouth daily. 90 tablet 3   No current facility-administered medications for this encounter.     No Known Allergies  Social History   Social History  . Marital status: Unknown    Spouse name: N/A  . Number of children: N/A  . Years of education: N/A   Occupational History  . Not on file.   Social History Main Topics  . Smoking status: Former Smoker    Quit date: 05/21/2015  . Smokeless tobacco: Not on file  . Alcohol use No  . Drug use: No  . Sexual activity: Not on file   Other Topics Concern  . Not on file   Social History Narrative  . No narrative on file    Family History    Problem Relation Age of Onset  . Heart attack Mother   . Emphysema Father   . Cancer Sister     ROS- All systems are reviewed and negative except as per the HPI above  Physical Exam: Vitals:   05/26/16 1032 05/26/16 1100  BP: (!) 172/104 (!) 150/90  Pulse: 91   Weight: 168 lb 3.2 oz (76.3 kg)   Height: 6' (1.829 m)     GEN- The patient is well appearing, alert and oriented x 3 today, but very anxious.   Head- normocephalic, atraumatic Eyes-  Sclera clear, conjunctiva pink Ears- hearing intact Oropharynx- clear Neck- supple, no JVP Lymph- no cervical lymphadenopathy Lungs- Clear to ausculation bilaterally, normal work of breathing Heart- Regular rate and rhythm, no murmurs, rubs or gallops, PMI not laterally displaced GI- soft, NT, ND, + BS Extremities- no clubbing, cyanosis, or edema MS- no significant deformity or atrophy Skin- no rash or lesion Psych- euthymic mood, full affect Neuro- strength and sensation are intact  EchoLeft ventricle: The cavity size was normal. Systolic function was   normal. The estimated ejection fraction was in the range of 55%   to 60%. Wall motion was normal; there were no regional wall   motion abnormalities. Doppler parameters are consistent with   abnormal left ventricular relaxation (grade 1 diastolic   dysfunction).-  EKG- NSR at 91 bpm, pr int 142 ms, qrs int 84 ms, qtc 437 ms Epic records reviewed  Assessment and Plan: 1. New onset PAF 3 ER visits for afib within  a short period of time, reviewed again how to manage at home and when it is appropriate to go to the ER. Anxiety and his fear of dying has driven his decision to go. Reassurred his fears that he would not have sudden death with afib Continue xarelto without interruption  Bleeding precautions given Continue to take daily cardizem with prn 30 mg cardizem as needed for PAF If continues to have high afib burden, will start antiarrythmic Echo reviewed with pt Has afib  number and knows how to call for help during the day or after hours  Encouraged to take anti anxiety med when needed  F/u in afib clinic in one month  Butch Penny C. Nieve Rojero, [facility] [address] [place], [place] [id number] [phone]

## 2016-06-27 ENCOUNTER — Encounter (HOSPITAL_COMMUNITY): Payer: Self-pay | Admitting: Nurse Practitioner

## 2016-06-27 ENCOUNTER — Ambulatory Visit (HOSPITAL_COMMUNITY)
Admission: RE | Admit: 2016-06-27 | Discharge: 2016-06-27 | Disposition: A | Discharge status: Home / Self Care | Payer: Medicare Other | Source: Ambulatory Visit | Attending: Nurse Practitioner | Admitting: Nurse Practitioner

## 2016-06-27 VITALS — BP 174/110 | HR 88 | Ht 72.0 in | Wt 169.4 lb

## 2016-06-27 DIAGNOSIS — I10 Essential (primary) hypertension: Secondary | ICD-10-CM | POA: Diagnosis not present

## 2016-06-27 DIAGNOSIS — I4891 Unspecified atrial fibrillation: Secondary | ICD-10-CM | POA: Diagnosis present

## 2016-06-27 DIAGNOSIS — Z7901 Long term (current) use of anticoagulants: Secondary | ICD-10-CM | POA: Insufficient documentation

## 2016-06-27 DIAGNOSIS — Z87891 Personal history of nicotine dependence: Secondary | ICD-10-CM | POA: Insufficient documentation

## 2016-06-27 DIAGNOSIS — I480 Paroxysmal atrial fibrillation: Secondary | ICD-10-CM | POA: Diagnosis not present

## 2016-06-27 NOTE — Progress Notes (Signed)
Patient ID: Todd Bright, male   DOB: 03/01/1941, 75 y.o.   MRN: [id number]     Primary Care Physician: Sherrie Mustache, MD Referring Physician: ER f/u   Todd Bright is a 75 y.o. male with a h/o new onset afib that presented to the [facility] ER 7/27 for fast heart rate that started the day before when pt was working out in the heat working pulling weeds and cutting down tall grass. He was successfully cardioverted. He also drank a 12 oz Dr. Malachi Bonds that afternoon, which he normally does not drink caffeine. He also quit smoking a week or two before and also had received news of his grandson's death a short time in the last few weeks He was cardioverted in the ER and started on xarelto. He does recall a similar episode in the past but it was not sustained. Chadsvasc score of 3. He states that he has "white coat hypertension" and his BP is normal. He does appear very anxious today. He does not drink alcohol and denies snoring history. No bleeding issues with xarelto on board. He was given xarelto starter kit, but was called at home and placed on correct dose of 20 mg a day. He also states was started on BB in the past by PCP for HTN but "couldn't put one foot in front of another" and it was stopped.  He returns to clinic today after being in the ER two additional times for afib. One time cardizem drip converted pat and  8/27, he was cardioverted. We again discussed when it was appropriate to go to the Er for treatment and how to manage at home.  This was discussed on previous visit, but pt gets so anxious, I think he only comprehends a small amount of what I say. He is so nervous this am that his hands are shaking and his voice is quivering and comes close to tears at times.His PCP gave him rx for ativan, which encouraged to take when he gets this anxious. Repeated instructions how to handle Afib at home. Echo done and revealed structurally normal heart.  Returns 9/25, he has not any further afib, so he  decided to stop his daily Cardizem. He has been tracking his BP at home, and they are much improved compared to  today's BP at 178/100, so he decided that he could stop diltiazem. Explained to pt that he is on dilt primarily to prevent afib episodes . He wants to stop his anticoagulant, thinks that the afib is behind him and won't reoccur. While, I was discussing afib and h/o of reoccurrences and stroke risk, he started getting very nervous and almost to the point of crying. He states that he gets very anxious at clinic visits.  Today, he denies symptoms of palpitations, chest pain, shortness of breath, orthopnea, PND, lower extremity edema, dizziness, presyncope, syncope, or neurologic sequela. Positive for extreme nervousness. The patient is tolerating medications without difficulties and is otherwise without complaint today.   Past Medical History:  Diagnosis Date  . Arrhythmia   . Cataracts, both eyes   . Hypertension    Past Surgical History:  Procedure Laterality Date  . CARDIOVERSION      Current Outpatient Prescriptions  Medication Sig Dispense Refill  . LORazepam (ATIVAN) 0.5 MG tablet Take 0.5 mg by mouth as needed for anxiety.    . Multiple Vitamin (MULTIVITAMIN WITH MINERALS) TABS tablet Take 1 tablet by mouth daily.    . Multiple Vitamins-Minerals (EYE VITAMINS &  MINERALS PO) Take 1 tablet by mouth daily.    . Omega-3 Fatty Acids (FISH OIL) 1000 MG CPDR Take 1,000 mg by mouth daily.    . rivaroxaban (XARELTO) 20 MG TABS tablet Take 1 tablet (20 mg total) by mouth daily with supper. 90 tablet 1  . simvastatin (ZOCOR) 10 MG tablet Take 1 tablet (10 mg total) by mouth daily. 90 tablet 3  . diltiazem (CARDIZEM CD) 120 MG 24 hr capsule Take 1 capsule (120 mg total) by mouth daily. (Patient not taking: Reported on 06/27/2016) 90 capsule 3  . diltiazem (CARDIZEM) 30 MG tablet Take 1 tablet (30 mg total) by mouth as needed. Take one tablet by mouth every 4 hours AS NEEDED for A-fib HR >  100 as long as BP > 100. (Patient not taking: Reported on 06/27/2016) 90 tablet 0   No current facility-administered medications for this encounter.     No Known Allergies  Social History   Social History  . Marital status: Unknown    Spouse name: N/A  . Number of children: N/A  . Years of education: N/A   Occupational History  . Not on file.   Social History Main Topics  . Smoking status: Former Smoker    Quit date: 05/21/2015  . Smokeless tobacco: Not on file  . Alcohol use No  . Drug use: No  . Sexual activity: Not on file   Other Topics Concern  . Not on file   Social History Narrative  . No narrative on file    Family History  Problem Relation Age of Onset  . Heart attack Mother   . Emphysema Father   . Cancer Sister     ROS- All systems are reviewed and negative except as per the HPI above  Physical Exam: Vitals:   06/27/16 1109  BP: (!) 174/110  Pulse: 88  Weight: 169 lb 6.4 oz (76.8 kg)  Height: 6' (1.829 m)  Rechecked at 170/100  GEN- The patient is well appearing, alert and oriented x 3 today, but very anxious.   Head- normocephalic, atraumatic Eyes-  Sclera clear, conjunctiva pink Ears- hearing intact Oropharynx- clear Neck- supple, no JVP Lymph- no cervical lymphadenopathy Lungs- Clear to ausculation bilaterally, normal work of breathing Heart- Regular rate and rhythm, no murmurs, rubs or gallops, PMI not laterally displaced GI- soft, NT, ND, + BS Extremities- no clubbing, cyanosis, or edema MS- no significant deformity or atrophy Skin- no rash or lesion Psych- euthymic mood, full affect Neuro- strength and sensation are intact  Echo Left ventricle: The cavity size was normal. Systolic function was   normal. The estimated ejection fraction was in the range of 55%   to 60%. Wall motion was normal; there were no regional wall   motion abnormalities. Doppler parameters are consistent with   abnormal left ventricular relaxation (grade 1  diastolic   dysfunction).-  EKG- NSR at 88 bpm, pr int 146 ms, qrs int 86 ms, qtc 440 ms Epic records reviewed  Assessment and Plan: 1. PAF Discussed that I would like him to stay on daily diltiazem to help prevent afib episodes  Continue xarelto without interruption, instructed  it would be against medical advice to stop and may put him at risk of stroke, with chadsvasc score of at least 3(agex2, HTN) Bleeding precautions given Has prn 30 mg cardizem as needed for PAF with v rates above 100 If develops  high afib burden, may need antiarrythmic Encouraged to take anti anxiety med  when needed  Prefers to f/u with PCP and I will can see if afib burden increases  Butch Penny C. Armani Brar, [facility] [address] [place], [place] [address] [phone]

## 2016-07-02 ENCOUNTER — Other Ambulatory Visit (HOSPITAL_COMMUNITY): Payer: Self-pay | Admitting: Nurse Practitioner

## 2016-07-02 DIAGNOSIS — I4891 Unspecified atrial fibrillation: Secondary | ICD-10-CM

## 2017-08-07 ENCOUNTER — Other Ambulatory Visit (HOSPITAL_COMMUNITY): Payer: Self-pay | Admitting: Nurse Practitioner

## 2017-08-07 DIAGNOSIS — I4891 Unspecified atrial fibrillation: Secondary | ICD-10-CM

## 2017-09-05 ENCOUNTER — Other Ambulatory Visit (HOSPITAL_COMMUNITY): Payer: Self-pay | Admitting: Nurse Practitioner

## 2017-09-05 DIAGNOSIS — I4891 Unspecified atrial fibrillation: Secondary | ICD-10-CM

## 2019-06-05 ENCOUNTER — Emergency Department (HOSPITAL_COMMUNITY): Payer: Medicare Other

## 2019-06-05 ENCOUNTER — Encounter (HOSPITAL_COMMUNITY): Payer: Self-pay | Admitting: Family Medicine

## 2019-06-05 ENCOUNTER — Other Ambulatory Visit: Payer: Self-pay

## 2019-06-05 ENCOUNTER — Inpatient Hospital Stay (HOSPITAL_COMMUNITY)
Admission: EM | Admit: 2019-06-05 | Discharge: 2019-06-11 | DRG: 956 | Disposition: A | Discharge status: Skilled Nursing Facility | Payer: Medicare Other | Attending: Internal Medicine | Admitting: Internal Medicine

## 2019-06-05 DIAGNOSIS — Z7982 Long term (current) use of aspirin: Secondary | ICD-10-CM

## 2019-06-05 DIAGNOSIS — I509 Heart failure, unspecified: Secondary | ICD-10-CM | POA: Diagnosis present

## 2019-06-05 DIAGNOSIS — Z8249 Family history of ischemic heart disease and other diseases of the circulatory system: Secondary | ICD-10-CM | POA: Diagnosis not present

## 2019-06-05 DIAGNOSIS — E559 Vitamin D deficiency, unspecified: Secondary | ICD-10-CM | POA: Diagnosis present

## 2019-06-05 DIAGNOSIS — H269 Unspecified cataract: Secondary | ICD-10-CM | POA: Diagnosis present

## 2019-06-05 DIAGNOSIS — E1122 Type 2 diabetes mellitus with diabetic chronic kidney disease: Secondary | ICD-10-CM | POA: Diagnosis present

## 2019-06-05 DIAGNOSIS — I10 Essential (primary) hypertension: Secondary | ICD-10-CM

## 2019-06-05 DIAGNOSIS — M25551 Pain in right hip: Secondary | ICD-10-CM | POA: Diagnosis present

## 2019-06-05 DIAGNOSIS — S72141A Displaced intertrochanteric fracture of right femur, initial encounter for closed fracture: Secondary | ICD-10-CM | POA: Diagnosis not present

## 2019-06-05 DIAGNOSIS — Z87891 Personal history of nicotine dependence: Secondary | ICD-10-CM

## 2019-06-05 DIAGNOSIS — N183 Chronic kidney disease, stage 3 unspecified: Secondary | ICD-10-CM | POA: Diagnosis present

## 2019-06-05 DIAGNOSIS — T796XXA Traumatic ischemia of muscle, initial encounter: Secondary | ICD-10-CM | POA: Diagnosis present

## 2019-06-05 DIAGNOSIS — W010XXA Fall on same level from slipping, tripping and stumbling without subsequent striking against object, initial encounter: Secondary | ICD-10-CM | POA: Diagnosis present

## 2019-06-05 DIAGNOSIS — Y92007 Garden or yard of unspecified non-institutional (private) residence as the place of occurrence of the external cause: Secondary | ICD-10-CM | POA: Diagnosis not present

## 2019-06-05 DIAGNOSIS — N179 Acute kidney failure, unspecified: Secondary | ICD-10-CM

## 2019-06-05 DIAGNOSIS — I482 Chronic atrial fibrillation, unspecified: Secondary | ICD-10-CM | POA: Diagnosis present

## 2019-06-05 DIAGNOSIS — Z79899 Other long term (current) drug therapy: Secondary | ICD-10-CM | POA: Diagnosis not present

## 2019-06-05 DIAGNOSIS — T1490XA Injury, unspecified, initial encounter: Secondary | ICD-10-CM

## 2019-06-05 DIAGNOSIS — Z20828 Contact with and (suspected) exposure to other viral communicable diseases: Secondary | ICD-10-CM | POA: Diagnosis present

## 2019-06-05 DIAGNOSIS — W19XXXA Unspecified fall, initial encounter: Secondary | ICD-10-CM

## 2019-06-05 DIAGNOSIS — I130 Hypertensive heart and chronic kidney disease with heart failure and stage 1 through stage 4 chronic kidney disease, or unspecified chronic kidney disease: Secondary | ICD-10-CM | POA: Diagnosis present

## 2019-06-05 DIAGNOSIS — S72001A Fracture of unspecified part of neck of right femur, initial encounter for closed fracture: Secondary | ICD-10-CM

## 2019-06-05 DIAGNOSIS — F411 Generalized anxiety disorder: Secondary | ICD-10-CM | POA: Diagnosis present

## 2019-06-05 HISTORY — DX: Chronic kidney disease, stage 3 unspecified: N18.30

## 2019-06-05 HISTORY — DX: Unspecified atrial fibrillation: I48.91

## 2019-06-05 HISTORY — DX: Acute kidney failure, unspecified: N17.9

## 2019-06-05 LAB — COMPREHENSIVE METABOLIC PANEL
ALT: 15 U/L (ref 0–44)
AST: 23 U/L (ref 15–41)
Albumin: 3.3 g/dL — ABNORMAL LOW (ref 3.5–5.0)
Alkaline Phosphatase: 74 U/L (ref 38–126)
Anion gap: 11 (ref 5–15)
BUN: 16 mg/dL (ref 8–23)
CO2: 23 mmol/L (ref 22–32)
Calcium: 8.4 mg/dL — ABNORMAL LOW (ref 8.9–10.3)
Chloride: 110 mmol/L (ref 98–111)
Creatinine, Ser: 1.75 mg/dL — ABNORMAL HIGH (ref 0.61–1.24)
GFR calc Af Amer: 42 mL/min — ABNORMAL LOW (ref >60)
GFR calc non Af Amer: 36 mL/min — ABNORMAL LOW (ref >60)
Glucose, Bld: 94 mg/dL (ref 70–99)
Potassium: 3.9 mmol/L (ref 3.5–5.1)
Sodium: 144 mmol/L (ref 135–145)
Total Bilirubin: 1.4 mg/dL — ABNORMAL HIGH (ref 0.3–1.2)
Total Protein: 6.5 g/dL (ref 6.5–8.1)

## 2019-06-05 LAB — SARS CORONAVIRUS 2 BY RT PCR (HOSPITAL ORDER, PERFORMED IN ~~LOC~~ HOSPITAL LAB): SARS Coronavirus 2: NEGATIVE

## 2019-06-05 LAB — CBC WITH DIFFERENTIAL/PLATELET
Abs Immature Granulocytes: 0.05 10*3/uL (ref 0.00–0.07)
Basophils Absolute: 0 10*3/uL (ref 0.0–0.1)
Basophils Relative: 0 %
Eosinophils Absolute: 0 10*3/uL (ref 0.0–0.5)
Eosinophils Relative: 0 %
HCT: 45.1 % (ref 39.0–52.0)
Hemoglobin: 15.3 g/dL (ref 13.0–17.0)
Immature Granulocytes: 0 %
Lymphocytes Relative: 9 %
Lymphs Abs: 1 10*3/uL (ref 0.7–4.0)
MCH: 32.2 pg (ref 26.0–34.0)
MCHC: 33.9 g/dL (ref 30.0–36.0)
MCV: 94.9 fL (ref 80.0–100.0)
Monocytes Absolute: 0.5 10*3/uL (ref 0.1–1.0)
Monocytes Relative: 4 %
Neutro Abs: 10.1 10*3/uL — ABNORMAL HIGH (ref 1.7–7.7)
Neutrophils Relative %: 87 %
Platelets: 197 10*3/uL (ref 150–400)
RBC: 4.75 MIL/uL (ref 4.22–5.81)
RDW: 12.7 % (ref 11.5–15.5)
WBC: 11.7 10*3/uL — ABNORMAL HIGH (ref 4.0–10.5)
nRBC: 0 % (ref 0.0–0.2)

## 2019-06-05 LAB — BRAIN NATRIURETIC PEPTIDE: B Natriuretic Peptide: 194.4 pg/mL — ABNORMAL HIGH (ref 0.0–100.0)

## 2019-06-05 MED ORDER — MORPHINE SULFATE (PF) 2 MG/ML IV SOLN: 0.5000 mg | INTRAVENOUS | Status: DC | PRN

## 2019-06-05 MED ORDER — DILTIAZEM HCL ER COATED BEADS 120 MG PO CP24
120.0000 mg | ORAL_CAPSULE | Freq: Every day | ORAL | Status: DC
  Filled 2019-06-05 (×2): qty 1

## 2019-06-05 MED ORDER — DILTIAZEM HCL 30 MG PO TABS
30.0000 mg | ORAL_TABLET | Freq: Once | ORAL | Status: AC
  Administered 2019-06-05: 22:00:00 30 mg via ORAL
  Filled 2019-06-05: qty 1

## 2019-06-05 MED ORDER — HYDROCODONE-ACETAMINOPHEN 5-325 MG PO TABS
1.0000 | ORAL_TABLET | Freq: Four times a day (QID) | ORAL | Status: DC | PRN
  Administered 2019-06-06: 09:00:00 1 via ORAL
  Administered 2019-06-06: 01:00:00 2 via ORAL
  Filled 2019-06-05: qty 1
  Filled 2019-06-05: qty 2

## 2019-06-05 MED ORDER — SODIUM CHLORIDE 0.9 % IV SOLN
INTRAVENOUS | Status: DC
  Administered 2019-06-05: 19:00:00 via INTRAVENOUS

## 2019-06-05 NOTE — ED Notes (Signed)
Pt is A-fib on monitor 

## 2019-06-05 NOTE — ED Triage Notes (Signed)
Pt arrived to ED from home by EMS. EMS reports pt was doing yard work and fell. Pt was laying in the sun for an unknown amount of time before a bystander found him. EMS also reported the pt's systolic BP was 409 and he was tachycardic so they gave the pt 2L of fluids and also gave 50 mcg Fentanyl for the pain in his right hip. Rt leg is shorted and rotated.Pt placed in position of comfort with bed locked and lowered, call bell in reach.

## 2019-06-05 NOTE — ED Provider Notes (Signed)
[facility] EMERGENCY DEPARTMENT Provider Note   CSN: [id number] Arrival date & time: 06/05/19  1620     History   Chief Complaint Chief Complaint  Patient presents with  . Hip Injury    HPI Todd Bright is a 78 y.o. male.     Patient had a fall when he got off of his lawn tractor while mowing the grass.  Fell on some rocks.  Complaint of right hip pain.  Did not hit his head no loss of consciousness.  Past medical history significant for atrial fibrillation he is on long-acting diltiazem for that.  Not on blood thinners.  And has a history of hypertension.     Past Medical History:  Diagnosis Date  . Arrhythmia   . Cataracts, both eyes   . Hypertension     Patient Active Problem List   Diagnosis Date Noted  . Closed intertrochanteric fracture of hip, right, initial encounter (HCC) 06/05/2019    Past Surgical History:  Procedure Laterality Date  . CARDIOVERSION          Home Medications    Prior to Admission medications   Medication Sig Start Date End Date Taking? Authorizing Provider  diltiazem (CARDIZEM CD) 120 MG 24 hr capsule Take 1 capsule (120 mg total) by mouth daily. 05/26/16   Newman Nip, NP  diltiazem (CARDIZEM) 30 MG tablet Take 1 tablet (30 mg total) by mouth as needed. Take one tablet by mouth every 4 hours AS NEEDED for A-fib HR > 100 as long as BP > 100. 05/26/16   Newman Nip, NP  diltiazem (CARDIZEM) 30 MG tablet TAKE 1 TABLEY EVERY 4 HOURS AS NEEDEDE FOR A-FIB HR >100 AS LONG AS BP >100 Patient taking differently: Take 30 mg by mouth every 4 (four) hours as needed (for A-Fib heart rate >100, as long as B/P is >100).  07/04/16   Newman Nip, NP  LORazepam (ATIVAN) 0.5 MG tablet Take 0.5 mg by mouth as needed for anxiety.    [provider]  Multiple Vitamin (MULTIVITAMIN WITH MINERALS) TABS tablet Take 1 tablet by mouth daily.    [provider]  Multiple Vitamins-Minerals (EYE VITAMINS & MINERALS  PO) Take 1 tablet by mouth daily.    [provider]  Omega-3 Fatty Acids (FISH OIL) 1000 MG CPDR Take 1,000 mg by mouth daily.    [provider]  rivaroxaban (XARELTO) 20 MG TABS tablet Take 1 tablet (20 mg total) by mouth daily with supper. 05/26/16   Newman Nip, NP  simvastatin (ZOCOR) 10 MG tablet TAKE 1 TABLET (10 MG TOTAL) DAILY BY MOUTH. PT NEEDS APPT FOR REFILLS Patient taking differently: Take 10 mg by mouth daily.  09/06/17   Newman Nip, NP    Family History Family History  Problem Relation Age of Onset  . Heart attack Mother   . Emphysema Father   . Cancer Sister     Social History Social History   Tobacco Use  . Smoking status: Former Smoker    Quit date: 05/21/2015    Years since quitting: 4.0  Substance Use Topics  . Alcohol use: No  . Drug use: No     Allergies   Patient has no known allergies.   Review of Systems Review of Systems  Constitutional: Negative for chills and fever.  HENT: Negative for congestion, rhinorrhea and sore throat.   Eyes: Negative for visual disturbance.  Respiratory: Negative for cough and  shortness of breath.   Cardiovascular: Negative for chest pain and leg swelling.  Gastrointestinal: Negative for abdominal pain, diarrhea, nausea and vomiting.  Genitourinary: Negative for dysuria.  Musculoskeletal: Negative for back pain and neck pain.  Skin: Negative for rash.  Neurological: Negative for dizziness, light-headedness and headaches.  Hematological: Does not bruise/bleed easily.  Psychiatric/Behavioral: Negative for confusion.     Physical Exam Updated Vital Signs BP 121/75   Pulse 98   Temp 98.4 F (36.9 C) (Oral)   Resp 20   Ht 1.829 m (6')   Wt 72.6 kg   SpO2 99%   BMI 21.70 kg/m   Physical Exam Vitals signs and nursing note reviewed.  Constitutional:      Appearance: Normal appearance. He is well-developed.  HENT:     Head: Normocephalic and atraumatic.  Eyes:     Extraocular  Movements: Extraocular movements intact.     Conjunctiva/sclera: Conjunctivae normal.     Pupils: Pupils are equal, round, and reactive to light.  Neck:     Musculoskeletal: Neck supple.  Cardiovascular:     Rate and Rhythm: Normal rate and regular rhythm.     Heart sounds: No murmur.  Pulmonary:     Effort: Pulmonary effort is normal. No respiratory distress.     Breath sounds: Normal breath sounds.  Abdominal:     Palpations: Abdomen is soft.     Tenderness: There is no abdominal tenderness.  Musculoskeletal:        General: Deformity present. No swelling.     Comments: Patient with shortening of the right leg.  Externally rotated.  Dorsalis pedis pulses 1+.  No edema.  Good movement of toes sensation intact.  Skin:    General: Skin is warm and dry.     Capillary Refill: Capillary refill takes less than 2 seconds.  Neurological:     General: No focal deficit present.     Mental Status: He is alert and oriented to person, place, and time.      ED Treatments / Results  Labs (all labs ordered are listed, but only abnormal results are displayed) Labs Reviewed  BRAIN NATRIURETIC PEPTIDE - Abnormal; Notable for the following components:      Result Value   B Natriuretic Peptide 194.4 (*)    All other components within normal limits  COMPREHENSIVE METABOLIC PANEL - Abnormal; Notable for the following components:   Creatinine, Ser 1.75 (*)    Calcium 8.4 (*)    Albumin 3.3 (*)    Total Bilirubin 1.4 (*)    GFR calc non Af Amer 36 (*)    GFR calc Af Amer 42 (*)    All other components within normal limits  CBC WITH DIFFERENTIAL/PLATELET - Abnormal; Notable for the following components:   WBC 11.7 (*)    Neutro Abs 10.1 (*)    All other components within normal limits  SARS CORONAVIRUS 2 (HOSPITAL ORDER, PERFORMED IN [facility])    EKG None   ED ECG REPORT   Date: 06/05/2019  Rate: 107  Rhythm: atrial fibrillation  QRS Axis: right  Intervals: normal   ST/T Wave abnormalities: normal  Conduction Disutrbances:none  Narrative Interpretation:   Old EKG Reviewed: none available  I have personally reviewed the EKG tracing and agree with the computerized printout as noted.   Radiology Dg Chest 1 View  Result Date: 06/05/2019 CLINICAL DATA:  Hip fracture, fall EXAM: CHEST  1 VIEW COMPARISON:  05/23/2016 FINDINGS: The heart is  slightly enlarged. No focal opacity or pleural effusion. Bilateral skin fold artifacts. No definite pneumothorax. IMPRESSION: No active disease.  Mild cardiomegaly. Electronically Signed   By: Donavan Foil M.D.   On: 06/05/2019 17:37   Dg Hips Bilat W Or Wo Pelvis 3-4 Views  Result Date: 06/05/2019 CLINICAL DATA:  Fall EXAM: DG HIP (WITH OR WITHOUT PELVIS) 3-4V BILAT COMPARISON:  None. FINDINGS: Right hip: Acute comminuted intertrochanteric fracture with displacement of the lesser trochanter. The right femoral head projects in joint. Left hip: No fracture or malalignment. Pubic symphysis and rami appear intact. IMPRESSION: 1. Acute comminuted right intertrochanteric fracture 2. No acute osseous abnormality of the left hip Electronically Signed   By: Donavan Foil M.D.   On: 06/05/2019 17:37    Procedures Procedures (including critical care time)  Medications Ordered in ED Medications  0.9 %  sodium chloride infusion (has no administration in time range)     Initial Impression / Assessment and Plan / ED Course  I have reviewed the triage vital signs and the nursing notes.  Pertinent labs & imaging results that were available during my care of the patient were reviewed by me and considered in my medical decision making (see chart for details).       EKG shows atrial fibrillation heart rate sometimes in the low 100s.  Sometimes in the 90s.  Chest x-ray negative x-ray of hips shows a right inotropic fracture.  This is closed fracture.  Labs without significant abnormalities.  Other than the creatinine was up some at  1.75.  And his BNP was 194.  Discussed with Dr. Percell Miller from orthopedics.  Once patient n.p.o. after midnight plans to fix the hip fracture tomorrow.  Discussed with hospitalist they will admit.     Final Clinical Impressions(s) / ED Diagnoses   Final diagnoses:  Closed fracture of right hip, initial encounter [facility])  Chronic atrial fibrillation    ED Discharge Orders    None       Fredia Sorrow, MD 06/05/19 1859

## 2019-06-05 NOTE — ED Notes (Signed)
Patient transported to X-ray 

## 2019-06-05 NOTE — ED Notes (Signed)
Daughter's number [phone]

## 2019-06-05 NOTE — ED Notes (Signed)
ED TO INPATIENT HANDOFF REPORT  ED Nurse Name and Phone #: Percival Spanishlana [phone]  S Name/Age/Gender Todd Bright 78 y.o. male Room/Bed: 043C/043C  Code Status   Code Status: Not on file  Home/SNF/Other Home Patient oriented to: self, place, time and situation Is this baseline? Yes   Triage Complete: Triage complete  Chief Complaint Hip Fx  Triage Note Pt arrived to ED from home by EMS. EMS reports pt was doing yard work and fell. Pt was laying in the sun for an unknown amount of time before a bystander found him. EMS also reported the pt's systolic BP was 100 and he was tachycardic so they gave the pt 2L of fluids and also gave 50 mcg Fentanyl for the pain in his right hip. Rt leg is shorted and rotated.Pt placed in position of comfort with bed locked and lowered, call bell in reach.     Allergies No Known Allergies  Level of Care/Admitting Diagnosis ED Disposition    ED Disposition Condition Comment   Admit  [facility] [[id number]]  Level of Care: Telemetry Medical [104]  Covid Evaluation: Asymptomatic Screening Protocol (No Symptoms)  Diagnosis: Closed intertrochanteric fracture of hip, right, initial encounter [facility](HCC) [[id number]][[id number]]  Admitting Physician: Alberteen SamANFORD, CHRISTOPHER P [[id number]]  Attending Physician: Alberteen SamANFORD, CHRISTOPHER P [[id number]]  Estimated length of stay: past midnight tomorrow  Certification:: I certify this patient will need inpatient services for at least 2 midnights  PT Class (Do Not Modify): Inpatient [101]  PT Acc Code (Do Not Modify): Private [1]       B Medical/Surgery History Past Medical History:  Diagnosis Date  . Arrhythmia   . Atrial fibrillation (HCC)   . Cataracts, both eyes   . CKD (chronic kidney disease), stage III (HCC)   . Hypertension    Past Surgical History:  Procedure Laterality Date  . CARDIOVERSION       A IV Location/Drains/Wounds Patient Lines/Drains/Airways Status   Active Line/Drains/Airways     Name:   Placement date:   Placement time:   Site:   Days:   Peripheral IV 06/05/19 Right Antecubital   06/05/19    1630    Antecubital   less than 1          Intake/Output Last 24 hours No intake or output data in the 24 hours ending 06/05/19 2151  Labs/Imaging Results for orders placed or performed during the hospital encounter of 06/05/19 (from the past 48 hour(s))  Brain natriuretic peptide     Status: Abnormal   Collection Time: 06/05/19  4:45 PM  Result Value Ref Range   B Natriuretic Peptide 194.4 (H) 0.0 - 100.0 pg/mL    Comment: Performed at [facility], [address] [place], [place] [address]  Comprehensive metabolic panel     Status: Abnormal   Collection Time: 06/05/19  4:45 PM  Result Value Ref Range   Sodium 144 135 - 145 mmol/L   Potassium 3.9 3.5 - 5.1 mmol/L   Chloride 110 98 - 111 mmol/L   CO2 23 22 - 32 mmol/L   Glucose, Bld 94 70 - 99 mg/dL   BUN 16 8 - 23 mg/dL   Creatinine, Ser 4.691.75 (H) 0.61 - 1.24 mg/dL   Calcium 8.4 (L) 8.9 - 10.3 mg/dL   Total Protein 6.5 6.5 - 8.1 g/dL   Albumin 3.3 (L) 3.5 - 5.0 g/dL   AST 23 15 - 41 U/L   ALT 15 0 - 44  U/L   Alkaline Phosphatase 74 38 - 126 U/L   Total Bilirubin 1.4 (H) 0.3 - 1.2 mg/dL   GFR calc non Af Amer 36 (L) >60 mL/min   GFR calc Af Amer 42 (L) >60 mL/min   Anion gap 11 5 - 15    Comment: Performed at [facility] [address] [place], [place] [address]  CBC with Differential/Platelet     Status: Abnormal   Collection Time: 06/05/19  4:45 PM  Result Value Ref Range   WBC 11.7 (H) 4.0 - 10.5 K/uL   RBC 4.75 4.22 - 5.81 MIL/uL   Hemoglobin 15.3 13.0 - 17.0 g/dL   HCT 45.1 39.0 - 52.0 %   MCV 94.9 80.0 - 100.0 fL   MCH 32.2 26.0 - 34.0 pg   MCHC 33.9 30.0 - 36.0 g/dL   RDW 12.7 11.5 - 15.5 %   Platelets 197 150 - 400 K/uL   nRBC 0.0 0.0 - 0.2 %   Neutrophils Relative % 87 %   Neutro Abs 10.1 (H) 1.7 - 7.7 K/uL   Lymphocytes Relative 9 %   Lymphs Abs 1.0 0.7 - 4.0 K/uL    Monocytes Relative 4 %   Monocytes Absolute 0.5 0.1 - 1.0 K/uL   Eosinophils Relative 0 %   Eosinophils Absolute 0.0 0.0 - 0.5 K/uL   Basophils Relative 0 %   Basophils Absolute 0.0 0.0 - 0.1 K/uL   Immature Granulocytes 0 %   Abs Immature Granulocytes 0.05 0.00 - 0.07 K/uL    Comment: Performed at [facility] [address] [place], [place] [address]  SARS Coronavirus 2 [facility] order, Performed in [facility] hospital lab) Nasopharyngeal Nasopharyngeal Swab     Status: None   Collection Time: 06/05/19  6:27 PM   Specimen: Nasopharyngeal Swab  Result Value Ref Range   SARS Coronavirus 2 NEGATIVE NEGATIVE    Comment: (NOTE) If result is NEGATIVE SARS-CoV-2 target nucleic acids are NOT DETECTED. The SARS-CoV-2 RNA is generally detectable in upper and lower  respiratory specimens during the acute phase of infection. The lowest  concentration of SARS-CoV-2 viral copies this assay can detect is 250  copies / mL. A negative result does not preclude SARS-CoV-2 infection  and should not be used as the sole basis for treatment or other  patient management decisions.  A negative result may occur with  improper specimen collection / handling, submission of specimen other  than nasopharyngeal swab, presence of viral mutation(s) within the  areas targeted by this assay, and inadequate number of viral copies  (<250 copies / mL). A negative result must be combined with clinical  observations, patient history, and epidemiological information. If result is POSITIVE SARS-CoV-2 target nucleic acids are DETECTED. The SARS-CoV-2 RNA is generally detectable in upper and lower  respiratory specimens dur ing the acute phase of infection.  Positive  results are indicative of active infection with SARS-CoV-2.  Clinical  correlation with patient history and other diagnostic information is  necessary to determine patient infection status.  Positive results do  not rule out bacterial infection or  co-infection with other viruses. If result is PRESUMPTIVE POSTIVE SARS-CoV-2 nucleic acids MAY BE PRESENT.   A presumptive positive result was obtained on the submitted specimen  and confirmed on repeat testing.  While 2019 novel coronavirus  (SARS-CoV-2) nucleic acids may be present in the submitted sample  additional confirmatory testing may be necessary for epidemiological  and / or clinical  management purposes  to differentiate between  SARS-CoV-2 and other Sarbecovirus currently known to infect humans.  If clinically indicated additional testing with an alternate test  methodology [id number]) is advised. The SARS-CoV-2 RNA is generally  detectable in upper and lower respiratory sp ecimens during the acute  phase of infection. The expected result is Negative. Fact Sheet for Patients:  BoilerBrush.com.cy Fact Sheet for Healthcare Providers: https://pope.com/ This test is not yet approved or cleared by the [place] FDA and has been authorized for detection and/or diagnosis of SARS-CoV-2 by FDA under an Emergency Use Authorization (EUA).  This EUA will remain in effect (meaning this test can be used) for the duration of the COVID-19 declaration under Section 564(b)(1) of the Act, 21 U.S.C. section 360bbb-3(b)(1), unless the authorization is terminated or revoked sooner. Performed at [facility], [address], [place], [place] [address]    Dg Chest 1 View  Result Date: 06/05/2019 CLINICAL DATA:  Hip fracture, fall EXAM: CHEST  1 VIEW COMPARISON:  05/23/2016 FINDINGS: The heart is slightly enlarged. No focal opacity or pleural effusion. Bilateral skin fold artifacts. No definite pneumothorax. IMPRESSION: No active disease.  Mild cardiomegaly. Electronically Signed   By: Jasmine Pang M.D.   On: 06/05/2019 17:37   Dg Hips Bilat W Or Wo Pelvis 3-4 Views  Result Date: 06/05/2019 CLINICAL DATA:  Fall EXAM: DG HIP (WITH OR WITHOUT  PELVIS) 3-4V BILAT COMPARISON:  None. FINDINGS: Right hip: Acute comminuted intertrochanteric fracture with displacement of the lesser trochanter. The right femoral head projects in joint. Left hip: No fracture or malalignment. Pubic symphysis and rami appear intact. IMPRESSION: 1. Acute comminuted right intertrochanteric fracture 2. No acute osseous abnormality of the left hip Electronically Signed   By: Jasmine Pang M.D.   On: 06/05/2019 17:37    Pending Labs Unresulted Labs (From admission, onward)   None      Vitals/Pain Today's Vitals   06/05/19 1643 06/05/19 1931 06/05/19 2045 06/05/19 2100  BP:  (!) 141/97  138/87  Pulse:  (!) 105 63   Resp:  16 20 19   Temp:      TempSrc:      SpO2:  100% 99%   Weight:      Height:      PainSc: 2        Isolation Precautions No active isolations  Medications Medications  0.9 %  sodium chloride infusion ( Intravenous New Bag/Given 06/05/19 1928)    Mobility walks High fall risk   Focused Assessments    R Recommendations: See Admitting Provider Note  Report given to:   Additional Notes:

## 2019-06-06 ENCOUNTER — Encounter (HOSPITAL_COMMUNITY)
Admission: EM | Disposition: A | Discharge status: Skilled Nursing Facility | Payer: Self-pay | Source: Home / Self Care | Attending: Internal Medicine

## 2019-06-06 ENCOUNTER — Encounter (HOSPITAL_COMMUNITY): Payer: Self-pay | Admitting: Certified Registered Nurse Anesthetist

## 2019-06-06 ENCOUNTER — Inpatient Hospital Stay (HOSPITAL_COMMUNITY): Payer: Medicare Other

## 2019-06-06 ENCOUNTER — Inpatient Hospital Stay (HOSPITAL_COMMUNITY): Payer: Medicare Other | Admitting: Certified Registered Nurse Anesthetist

## 2019-06-06 HISTORY — PX: INTRAMEDULLARY (IM) NAIL INTERTROCHANTERIC: SHX5875

## 2019-06-06 LAB — SURGICAL PCR SCREEN
MRSA, PCR: NEGATIVE
Staphylococcus aureus: NEGATIVE

## 2019-06-06 LAB — BASIC METABOLIC PANEL
Anion gap: 11 (ref 5–15)
BUN: 15 mg/dL (ref 8–23)
CO2: 19 mmol/L — ABNORMAL LOW (ref 22–32)
Calcium: 8.5 mg/dL — ABNORMAL LOW (ref 8.9–10.3)
Chloride: 104 mmol/L (ref 98–111)
Creatinine, Ser: 1.1 mg/dL (ref 0.61–1.24)
GFR calc Af Amer: >60 mL/min (ref >60)
GFR calc non Af Amer: >60 mL/min (ref >60)
Glucose, Bld: 91 mg/dL (ref 70–99)
Potassium: 4.3 mmol/L (ref 3.5–5.1)
Sodium: 134 mmol/L — ABNORMAL LOW (ref 135–145)

## 2019-06-06 LAB — CBC
HCT: 46.3 % (ref 39.0–52.0)
Hemoglobin: 14.8 g/dL (ref 13.0–17.0)
MCH: 32.2 pg (ref 26.0–34.0)
MCHC: 32 g/dL (ref 30.0–36.0)
MCV: 100.7 fL — ABNORMAL HIGH (ref 80.0–100.0)
Platelets: 176 10*3/uL (ref 150–400)
RBC: 4.6 MIL/uL (ref 4.22–5.81)
RDW: 12.7 % (ref 11.5–15.5)
WBC: 6.8 10*3/uL (ref 4.0–10.5)
nRBC: 0 % (ref 0.0–0.2)

## 2019-06-06 LAB — TSH: TSH: 1.988 u[IU]/mL (ref 0.350–4.500)

## 2019-06-06 LAB — TYPE AND SCREEN
ABO/RH(D): A POS
Antibody Screen: NEGATIVE

## 2019-06-06 LAB — CK: Total CK: 487 U/L — ABNORMAL HIGH (ref 49–397)

## 2019-06-06 LAB — ABO/RH: ABO/RH(D): A POS

## 2019-06-06 SURGERY — FIXATION, FRACTURE, INTERTROCHANTERIC, WITH INTRAMEDULLARY ROD
Anesthesia: General | Site: Hip | Laterality: Right

## 2019-06-06 MED ORDER — CHLORHEXIDINE GLUCONATE 4 % EX LIQD: 60.0000 mL | Freq: Once | CUTANEOUS | Status: DC

## 2019-06-06 MED ORDER — SODIUM CHLORIDE 0.9 % IV SOLN: INTRAVENOUS | Status: DC

## 2019-06-06 MED ORDER — ENOXAPARIN SODIUM 40 MG/0.4ML ~~LOC~~ SOLN
40.0000 mg | SUBCUTANEOUS | Status: DC
  Administered 2019-06-07 – 2019-06-11 (×5): 40 mg via SUBCUTANEOUS
  Filled 2019-06-06 (×5): qty 0.4

## 2019-06-06 MED ORDER — PROPOFOL 10 MG/ML IV BOLUS
INTRAVENOUS | Status: DC | PRN
  Administered 2019-06-06: 80 mg via INTRAVENOUS

## 2019-06-06 MED ORDER — DEXAMETHASONE SODIUM PHOSPHATE 10 MG/ML IJ SOLN
INTRAMUSCULAR | Status: AC
  Filled 2019-06-06: qty 1

## 2019-06-06 MED ORDER — FENTANYL CITRATE (PF) 100 MCG/2ML IJ SOLN
INTRAMUSCULAR | Status: AC
  Filled 2019-06-06: qty 2

## 2019-06-06 MED ORDER — HYDROMORPHONE HCL 1 MG/ML IJ SOLN: 0.2500 mg | INTRAMUSCULAR | Status: DC | PRN

## 2019-06-06 MED ORDER — SUCCINYLCHOLINE CHLORIDE 200 MG/10ML IV SOSY
PREFILLED_SYRINGE | INTRAVENOUS | Status: DC | PRN
  Administered 2019-06-06: 30 mg via INTRAVENOUS

## 2019-06-06 MED ORDER — 0.9 % SODIUM CHLORIDE (POUR BTL) OPTIME
TOPICAL | Status: DC | PRN
  Administered 2019-06-06: 16:00:00 1000 mL

## 2019-06-06 MED ORDER — CEFAZOLIN SODIUM-DEXTROSE 2-4 GM/100ML-% IV SOLN
2.0000 g | INTRAVENOUS | Status: AC
  Administered 2019-06-06: 16:00:00 2 g via INTRAVENOUS
  Filled 2019-06-06: qty 100

## 2019-06-06 MED ORDER — ONDANSETRON HCL 4 MG/2ML IJ SOLN: 4.0000 mg | Freq: Four times a day (QID) | INTRAMUSCULAR | Status: DC | PRN

## 2019-06-06 MED ORDER — MAGNESIUM CITRATE PO SOLN: 1.0000 | Freq: Once | ORAL | Status: DC

## 2019-06-06 MED ORDER — SENNOSIDES-DOCUSATE SODIUM 8.6-50 MG PO TABS: 2.0000 | ORAL_TABLET | Freq: Every evening | ORAL | Status: DC | PRN

## 2019-06-06 MED ORDER — LACTATED RINGERS IV SOLN
INTRAVENOUS | Status: DC
  Administered 2019-06-06: 15:00:00 via INTRAVENOUS

## 2019-06-06 MED ORDER — LACTATED RINGERS IV SOLN
INTRAVENOUS | Status: DC | PRN
  Administered 2019-06-06 (×2): via INTRAVENOUS

## 2019-06-06 MED ORDER — FENTANYL CITRATE (PF) 250 MCG/5ML IJ SOLN
INTRAMUSCULAR | Status: DC | PRN
  Administered 2019-06-06: 50 ug via INTRAVENOUS

## 2019-06-06 MED ORDER — ASPIRIN EC 81 MG PO TBEC
81.0000 mg | DELAYED_RELEASE_TABLET | Freq: Every day | ORAL | Status: DC
  Administered 2019-06-06 – 2019-06-11 (×6): 81 mg via ORAL
  Filled 2019-06-06 (×6): qty 1

## 2019-06-06 MED ORDER — PHENYLEPHRINE 40 MCG/ML (10ML) SYRINGE FOR IV PUSH (FOR BLOOD PRESSURE SUPPORT)
PREFILLED_SYRINGE | INTRAVENOUS | Status: DC | PRN
  Administered 2019-06-06 (×4): 80 ug via INTRAVENOUS

## 2019-06-06 MED ORDER — POVIDONE-IODINE 10 % EX SWAB: 2.0000 "application " | Freq: Once | CUTANEOUS | Status: DC

## 2019-06-06 MED ORDER — LIDOCAINE 2% (20 MG/ML) 5 ML SYRINGE
INTRAMUSCULAR | Status: DC | PRN
  Administered 2019-06-06: 100 mg via INTRAVENOUS

## 2019-06-06 MED ORDER — METOPROLOL TARTRATE 5 MG/5ML IV SOLN: 5.0000 mg | Freq: Four times a day (QID) | INTRAVENOUS | Status: DC | PRN

## 2019-06-06 MED ORDER — ROCURONIUM BROMIDE 10 MG/ML (PF) SYRINGE
PREFILLED_SYRINGE | INTRAVENOUS | Status: DC | PRN
  Administered 2019-06-06: 40 mg via INTRAVENOUS

## 2019-06-06 MED ORDER — POLYETHYLENE GLYCOL 3350 17 G PO PACK
17.0000 g | PACK | Freq: Every day | ORAL | Status: DC | PRN
  Administered 2019-06-09: 09:00:00 17 g via ORAL
  Filled 2019-06-06: qty 1

## 2019-06-06 MED ORDER — ONDANSETRON HCL 4 MG/2ML IJ SOLN
INTRAMUSCULAR | Status: AC
  Filled 2019-06-06: qty 2

## 2019-06-06 MED ORDER — DOCUSATE SODIUM 100 MG PO CAPS
100.0000 mg | ORAL_CAPSULE | Freq: Two times a day (BID) | ORAL | Status: DC
  Administered 2019-06-06 – 2019-06-11 (×10): 100 mg via ORAL
  Filled 2019-06-06 (×10): qty 1

## 2019-06-06 MED ORDER — LORAZEPAM 0.5 MG PO TABS
0.5000 mg | ORAL_TABLET | Freq: Three times a day (TID) | ORAL | Status: DC | PRN
  Administered 2019-06-06 – 2019-06-10 (×6): 0.5 mg via ORAL
  Filled 2019-06-06 (×6): qty 1

## 2019-06-06 MED ORDER — DEXAMETHASONE SODIUM PHOSPHATE 10 MG/ML IJ SOLN
INTRAMUSCULAR | Status: DC | PRN
  Administered 2019-06-06: 10 mg via INTRAVENOUS

## 2019-06-06 MED ORDER — FENTANYL CITRATE (PF) 250 MCG/5ML IJ SOLN
INTRAMUSCULAR | Status: AC
  Filled 2019-06-06: qty 5

## 2019-06-06 MED ORDER — SODIUM CHLORIDE 0.9 % IV SOLN
INTRAVENOUS | Status: AC
  Administered 2019-06-06: 22:00:00 via INTRAVENOUS

## 2019-06-06 MED ORDER — SUGAMMADEX SODIUM 200 MG/2ML IV SOLN
INTRAVENOUS | Status: DC | PRN
  Administered 2019-06-06: 150 mg via INTRAVENOUS

## 2019-06-06 MED ORDER — ENSURE ENLIVE PO LIQD
237.0000 mL | Freq: Two times a day (BID) | ORAL | Status: DC
  Administered 2019-06-07 – 2019-06-11 (×10): 237 mL via ORAL

## 2019-06-06 MED ORDER — HYDROCODONE-ACETAMINOPHEN 5-325 MG PO TABS: 1.0000 | ORAL_TABLET | Freq: Four times a day (QID) | ORAL | 0 refills | Status: DC | PRN

## 2019-06-06 MED ORDER — ONDANSETRON HCL 4 MG/2ML IJ SOLN
INTRAMUSCULAR | Status: DC | PRN
  Administered 2019-06-06: 4 mg via INTRAVENOUS

## 2019-06-06 MED ORDER — ACETAMINOPHEN 325 MG PO TABS: 325.0000 mg | ORAL_TABLET | Freq: Four times a day (QID) | ORAL | Status: DC | PRN

## 2019-06-06 MED ORDER — FENTANYL CITRATE (PF) 100 MCG/2ML IJ SOLN
25.0000 ug | INTRAMUSCULAR | Status: DC | PRN
  Administered 2019-06-06: 25 ug via INTRAVENOUS
  Administered 2019-06-06: 50 ug via INTRAVENOUS

## 2019-06-06 MED ORDER — BISACODYL 10 MG RE SUPP
10.0000 mg | Freq: Every day | RECTAL | Status: DC | PRN
  Administered 2019-06-09: 09:00:00 10 mg via RECTAL
  Filled 2019-06-06: qty 1

## 2019-06-06 MED ORDER — CEFAZOLIN SODIUM-DEXTROSE 2-4 GM/100ML-% IV SOLN
2.0000 g | Freq: Four times a day (QID) | INTRAVENOUS | Status: AC
  Administered 2019-06-06 – 2019-06-07 (×2): 2 g via INTRAVENOUS
  Filled 2019-06-06 (×2): qty 100

## 2019-06-06 MED ORDER — DILTIAZEM HCL ER COATED BEADS 120 MG PO CP24
120.0000 mg | ORAL_CAPSULE | Freq: Every day | ORAL | Status: DC
  Administered 2019-06-06 – 2019-06-11 (×6): 120 mg via ORAL
  Filled 2019-06-06 (×6): qty 1

## 2019-06-06 MED ORDER — ENOXAPARIN SODIUM 40 MG/0.4ML ~~LOC~~ SOLN
40.0000 mg | SUBCUTANEOUS | Status: DC
  Filled 2019-06-06: qty 0.4

## 2019-06-06 MED ORDER — ENOXAPARIN SODIUM 40 MG/0.4ML ~~LOC~~ SOLN: 40.0000 mg | SUBCUTANEOUS | 0 refills | Status: DC

## 2019-06-06 MED ORDER — ACETAMINOPHEN 500 MG PO TABS
500.0000 mg | ORAL_TABLET | Freq: Four times a day (QID) | ORAL | Status: AC
  Administered 2019-06-06 – 2019-06-07 (×4): 500 mg via ORAL
  Filled 2019-06-06 (×3): qty 1

## 2019-06-06 MED ORDER — SODIUM CHLORIDE 0.9 % IV SOLN
INTRAVENOUS | Status: DC | PRN
  Administered 2019-06-06: 30 ug/min via INTRAVENOUS

## 2019-06-06 MED ORDER — METOPROLOL TARTRATE 5 MG/5ML IV SOLN: 5.0000 mg | INTRAVENOUS | Status: DC | PRN

## 2019-06-06 MED ORDER — POLYETHYLENE GLYCOL 3350 17 G PO PACK: 17.0000 g | PACK | Freq: Every day | ORAL | Status: DC | PRN

## 2019-06-06 MED ORDER — HYDROCODONE-ACETAMINOPHEN 5-325 MG PO TABS
1.0000 | ORAL_TABLET | Freq: Four times a day (QID) | ORAL | Status: DC | PRN
  Administered 2019-06-07: 1 via ORAL
  Administered 2019-06-07: 2 via ORAL
  Administered 2019-06-08: 15:00:00 1 via ORAL
  Administered 2019-06-08: 2 via ORAL
  Filled 2019-06-06: qty 1
  Filled 2019-06-06 (×2): qty 2
  Filled 2019-06-06: qty 1

## 2019-06-06 MED ORDER — ESCITALOPRAM OXALATE 10 MG PO TABS
10.0000 mg | ORAL_TABLET | Freq: Every day | ORAL | Status: DC
  Administered 2019-06-07 – 2019-06-11 (×5): 10 mg via ORAL
  Filled 2019-06-06 (×5): qty 1

## 2019-06-06 MED ORDER — ONDANSETRON HCL 4 MG PO TABS: 4.0000 mg | ORAL_TABLET | Freq: Four times a day (QID) | ORAL | Status: DC | PRN

## 2019-06-06 MED ORDER — SIMVASTATIN 20 MG PO TABS
10.0000 mg | ORAL_TABLET | Freq: Every evening | ORAL | Status: DC
  Administered 2019-06-07 – 2019-06-11 (×5): 10 mg via ORAL
  Filled 2019-06-06 (×5): qty 1

## 2019-06-06 MED ORDER — LORAZEPAM 2 MG/ML IJ SOLN
1.0000 mg | Freq: Once | INTRAMUSCULAR | Status: AC
  Administered 2019-06-06: 1 mg via INTRAVENOUS
  Filled 2019-06-06: qty 1

## 2019-06-06 SURGICAL SUPPLY — 39 items
BIT DRILL AO GAMMA 4.2X180 (BIT) ×1 IMPLANT
CLSR STERI-STRIP ANTIMIC 1/2X4 (GAUZE/BANDAGES/DRESSINGS) ×2 IMPLANT
COVER MAYO STAND STRL (DRAPES) ×2 IMPLANT
COVER PERINEAL POST (MISCELLANEOUS) ×2 IMPLANT
COVER SURGICAL LIGHT HANDLE (MISCELLANEOUS) ×2 IMPLANT
COVER WAND RF STERILE (DRAPES) ×2 IMPLANT
DRAPE STERI IOBAN 125X83 (DRAPES) ×2 IMPLANT
DRSG MEPILEX BORDER 4X4 (GAUZE/BANDAGES/DRESSINGS) ×6 IMPLANT
DURAPREP 26ML APPLICATOR (WOUND CARE) ×2 IMPLANT
ELECT REM PT RETURN 9FT ADLT (ELECTROSURGICAL) ×2
ELECTRODE REM PT RTRN 9FT ADLT (ELECTROSURGICAL) ×1 IMPLANT
GLOVE BIO SURGEON STRL SZ7.5 (GLOVE) ×4 IMPLANT
GLOVE BIOGEL PI IND STRL 8 (GLOVE) ×2 IMPLANT
GLOVE BIOGEL PI INDICATOR 8 (GLOVE) ×2
GOWN STRL REUS W/ TWL LRG LVL3 (GOWN DISPOSABLE) ×2 IMPLANT
GOWN STRL REUS W/TWL LRG LVL3 (GOWN DISPOSABLE) ×4
GUIDEROD T2 3X1000 (ROD) ×1 IMPLANT
K-WIRE  3.2X450M STR (WIRE) ×1
K-WIRE 3.2X450M STR (WIRE) ×1
KIT BASIN OR (CUSTOM PROCEDURE TRAY) ×2 IMPLANT
KIT TURNOVER KIT B (KITS) ×2 IMPLANT
KWIRE 3.2X450M STR (WIRE) IMPLANT
MANIFOLD NEPTUNE II (INSTRUMENTS) ×2 IMPLANT
NAIL GAMMA LG R 5TI 10X400X125 (Nail) ×1 IMPLANT
NS IRRIG 1000ML POUR BTL (IV SOLUTION) ×2 IMPLANT
PACK GENERAL/GYN (CUSTOM PROCEDURE TRAY) ×2 IMPLANT
PAD ARMBOARD 7.5X6 YLW CONV (MISCELLANEOUS) ×4 IMPLANT
SCREW LAG GAMMA 3 TI 10.5X105M (Screw) ×1 IMPLANT
SCREW LOCKING T2 F/T  5MMX50MM (Screw) ×1 IMPLANT
SCREW LOCKING T2 F/T 5MMX50MM (Screw) IMPLANT
STRIP CLOSURE SKIN 1/2X4 (GAUZE/BANDAGES/DRESSINGS) ×2 IMPLANT
SUT MNCRL AB 4-0 PS2 18 (SUTURE) ×2 IMPLANT
SUT VIC AB 0 CT1 27 (SUTURE) ×2
SUT VIC AB 0 CT1 27XBRD ANBCTR (SUTURE) IMPLANT
SUT VIC AB 2-0 CT1 27 (SUTURE) ×2
SUT VIC AB 2-0 CT1 TAPERPNT 27 (SUTURE) ×1 IMPLANT
TOWEL GREEN STERILE (TOWEL DISPOSABLE) ×2 IMPLANT
TOWEL OR NON WOVEN STRL DISP B (DISPOSABLE) ×2 IMPLANT
WATER STERILE IRR 1000ML POUR (IV SOLUTION) ×2 IMPLANT

## 2019-06-06 NOTE — Progress Notes (Signed)
Pt arrived to unit, call bell within reach. Pt in no distress.

## 2019-06-06 NOTE — Op Note (Signed)
DATE OF SURGERY:  06/06/2019  TIME: 2:51 PM  PATIENT NAME:  Todd Bright  AGE: 79 y.o.  PRE-OPERATIVE DIAGNOSIS:  right hip fracture  POST-OPERATIVE DIAGNOSIS:  SAME  PROCEDURE:  INTRAMEDULLARY (IM) NAIL INTERTROCHANTRIC  SURGEON:  Renette Butters  ASSISTANT:  Roxan Hockey, PA-C, he was present and scrubbed throughout the case, critical for completion in a timely fashion, and for retraction, instrumentation, and closure.   OPERATIVE IMPLANTS: Stryker Gamma Nail  PREOPERATIVE INDICATIONS:  [name] is a 78 y.o. year old who fell and suffered a hip fracture. He was brought into the ER and then admitted and optimized and then elected for surgical intervention.    The risks benefits and alternatives were discussed with the patient including but not limited to the risks of nonoperative treatment, versus surgical intervention including infection, bleeding, nerve injury, malunion, nonunion, hardware prominence, hardware failure, need for hardware removal, blood clots, cardiopulmonary complications, morbidity, mortality, among others, and they were willing to proceed.    OPERATIVE PROCEDURE:  The patient was brought to the operating room and placed in the supine position. General anesthesia was administered. He was placed on the fracture table.  Closed reduction was performed under C-arm guidance. Time out was then performed after sterile prep and drape. He received preoperative antibiotics.  Incision was made proximal to the greater trochanter. A guidewire was placed in the appropriate position. Confirmation was made on AP and lateral views. The above-named nail was opened. I opened the proximal femur with a reamer. I then placed the nail by hand easily down. I did not need to ream the femur.  Once the nail was completely seated, I placed a guidepin into the femoral head into the center center position. I measured the length, and then reamed the lateral cortex and up into the head. I  then placed the lag screw. Slight compression was applied. Anatomic fixation achieved. Bone quality was mediocre.  I then secured the proximal interlocking bolt, and took off a half a turn, and then removed the instruments, and took final C-arm pictures AP and lateral the entire length of the leg.  I then used perfect circles technique to place a distal interlock screw.   Anatomic reconstruction was achieved, and the wounds were irrigated copiously and closed with Vicryl followed by staples and sterile gauze for the skin. The patient was awakened and returned to PACU in stable and satisfactory condition. There no complications and the patient tolerated the procedure well.  He will be weightbearing as tolerated, and will be on chemical px  for a period of four weeks after discharge.   Edmonia Lynch, M.D.

## 2019-06-06 NOTE — Interval H&P Note (Signed)
I participated in the care of this patient and agree with the above history, physical and evaluation. I performed a review of the history and a physical exam as detailed   Ernan Runkles Daniel Symon Norwood MD  

## 2019-06-06 NOTE — Consult Note (Signed)
Reason for Consult:Right hip fx Referring Physician: A Marcelles Bright is an 78 y.o. male.  HPI: Todd Bright was out in the garden and slipped and fell. He hit his right hip on Todd rock when he fell and had immediate pain. He could not get up and was brought to the ED. X-rays showed Todd right hip fx and orthopedic surgery was consulted. He is very active and lives at home with his demented wife, for whom he cares, and his daughter.  Past Medical History:  Diagnosis Date  . Arrhythmia   . Atrial fibrillation (Todd Bright)   . Cataracts, both eyes   . CKD (chronic kidney disease), stage III ([place])   . Hypertension     Past Surgical History:  Procedure Laterality Date  . CARDIOVERSION      Family History  Problem Relation Age of Onset  . Heart attack Mother   . Emphysema Father   . Cancer Sister     Social History:  reports that he quit smoking about 4 years ago. He has never used smokeless tobacco. He reports that he does not drink alcohol or use drugs.  Allergies: No Known Allergies  Medications: I have reviewed the patient's current medications.  Results for orders placed or performed during the hospital encounter of 06/05/19 (from the past 48 hour(s))  Brain natriuretic peptide     Status: Abnormal   Collection Time: 06/05/19  4:45 PM  Result Value Ref Range   B Natriuretic Peptide 194.4 (H) 0.0 - 100.0 pg/mL    Comment: Performed at [facility], [address] [place], [place] [address]  Comprehensive metabolic panel     Status: Abnormal   Collection Time: 06/05/19  4:45 PM  Result Value Ref Range   Sodium 144 135 - 145 mmol/L   Potassium 3.9 3.5 - 5.1 mmol/L   Chloride 110 98 - 111 mmol/L   CO2 23 22 - 32 mmol/L   Glucose, Bld 94 70 - 99 mg/dL   BUN 16 8 - 23 mg/dL   Creatinine, Ser 1.75 (H) 0.61 - 1.24 mg/dL   Calcium 8.4 (L) 8.9 - 10.3 mg/dL   Total Protein 6.5 6.5 - 8.1 g/dL   Albumin 3.3 (L) 3.5 - 5.0 g/dL   AST 23 15 - 41 U/L   ALT 15 0 - 44 U/L   Alkaline  Phosphatase 74 38 - 126 U/L   Total Bilirubin 1.4 (H) 0.3 - 1.2 mg/dL   GFR calc non Af Amer 36 (L) >60 mL/min   GFR calc Af Amer 42 (L) >60 mL/min   Anion gap 11 5 - 15    Comment: Performed at [facility] [address] [place], [place] [address]  CBC with Differential/Platelet     Status: Abnormal   Collection Time: 06/05/19  4:45 PM  Result Value Ref Range   WBC 11.7 (H) 4.0 - 10.5 K/uL   RBC 4.75 4.22 - 5.81 MIL/uL   Hemoglobin 15.3 13.0 - 17.0 g/dL   HCT 45.1 39.0 - 52.0 %   MCV 94.9 80.0 - 100.0 fL   MCH 32.2 26.0 - 34.0 pg   MCHC 33.9 30.0 - 36.0 g/dL   RDW 12.7 11.5 - 15.5 %   Platelets 197 150 - 400 K/uL   nRBC 0.0 0.0 - 0.2 %   Neutrophils Relative % 87 %   Neutro Abs 10.1 (H) 1.7 - 7.7 K/uL   Lymphocytes Relative 9 %  Lymphs Abs 1.0 0.7 - 4.0 K/uL   Monocytes Relative 4 %   Monocytes Absolute 0.5 0.1 - 1.0 K/uL   Eosinophils Relative 0 %   Eosinophils Absolute 0.0 0.0 - 0.5 K/uL   Basophils Relative 0 %   Basophils Absolute 0.0 0.0 - 0.1 K/uL   Immature Granulocytes 0 %   Abs Immature Granulocytes 0.05 0.00 - 0.07 K/uL    Comment: Performed at [facility], [address] [place],  [address]  SARS Coronavirus 2 (Hospital order, Performed in [facility] hospital lab) Nasopharyngeal Nasopharyngeal Swab     Status: None   Collection Time: 06/05/19  6:27 PM   Specimen: Nasopharyngeal Swab  Result Value Ref Range   SARS Coronavirus 2 NEGATIVE NEGATIVE    Comment: (NOTE) If result is NEGATIVE SARS-CoV-2 target nucleic acids are NOT DETECTED. The SARS-CoV-2 RNA is generally detectable in upper and lower  respiratory specimens during the acute phase of infection. The lowest  concentration of SARS-CoV-2 viral copies this assay can detect is 250  copies / mL. Todd negative result does not preclude SARS-CoV-2 infection  and should not be used as the sole basis for treatment or other  patient management decisions.  Todd negative result may occur with   improper specimen collection / handling, submission of specimen other  than nasopharyngeal swab, presence of viral mutation(s) within the  areas targeted by this assay, and inadequate number of viral copies  (<250 copies / mL). Todd negative result must be combined with clinical  observations, patient history, and epidemiological information. If result is POSITIVE SARS-CoV-2 target nucleic acids are DETECTED. The SARS-CoV-2 RNA is generally detectable in upper and lower  respiratory specimens dur ing the acute phase of infection.  Positive  results are indicative of active infection with SARS-CoV-2.  Clinical  correlation with patient history and other diagnostic information is  necessary to determine patient infection status.  Positive results do  not rule out bacterial infection or co-infection with other viruses. If result is PRESUMPTIVE POSTIVE SARS-CoV-2 nucleic acids MAY BE PRESENT.   Todd presumptive positive result was obtained on the submitted specimen  and confirmed on repeat testing.  While 2019 novel coronavirus  (SARS-CoV-2) nucleic acids may be present in the submitted sample  additional confirmatory testing may be necessary for epidemiological  and / or clinical management purposes  to differentiate between  SARS-CoV-2 and other Sarbecovirus currently known to infect humans.  If clinically indicated additional testing with an alternate test  methodology ([id number]) is advised. The SARS-CoV-2 RNA is generally  detectable in upper and lower respiratory sp ecimens during the acute  phase of infection. The expected result is Negative. Fact Sheet for Patients:  https://www.fda.gov/media/136312/download Fact Sheet for Healthcare Providers: https://www.fda.gov/media/136313/download This test is not yet approved or cleared by the [place] FDA and has been authorized for detection and/or diagnosis of SARS-CoV-2 by FDA under an Emergency Use Authorization (EUA).  This EUA will  remain in effect (meaning this test can be used) for the duration of the COVID-19 declaration under Section 564(b)(1) of the Act, 21 U.S.C. section 360bbb-3(b)(1), unless the authorization is terminated or revoked sooner. Performed at [facility], [address] [place],  [address]     Dg Chest 1 View  Result Date: 06/05/2019 CLINICAL DATA:  Hip fracture, fall EXAM: CHEST  1 VIEW COMPARISON:  05/23/2016 FINDINGS: The heart is slightly enlarged. No focal opacity or pleural effusion. Bilateral skin fold artifacts. No definite   pneumothorax. IMPRESSION: No active disease.  Mild cardiomegaly. Electronically Signed   By: Jasmine PangKim  Fujinaga M.D.   On: 06/05/2019 17:37   Dg Hips Bilat W Or Wo Pelvis 3-4 Views  Result Date: 06/05/2019 CLINICAL DATA:  Fall EXAM: DG HIP (WITH OR WITHOUT PELVIS) 3-4V BILAT COMPARISON:  None. FINDINGS: Right hip: Acute comminuted intertrochanteric fracture with displacement of the lesser trochanter. The right femoral head projects in joint. Left hip: No fracture or malalignment. Pubic symphysis and rami appear intact. IMPRESSION: 1. Acute comminuted right intertrochanteric fracture 2. No acute osseous abnormality of the left hip Electronically Signed   By: Jasmine PangKim  Fujinaga M.D.   On: 06/05/2019 17:37    Review of Systems  Constitutional: Negative for weight loss.  HENT: Negative for ear discharge, ear pain, hearing loss and tinnitus.   Eyes: Negative for blurred vision, double vision, photophobia and pain.  Respiratory: Negative for cough, sputum production and shortness of breath.   Cardiovascular: Negative for chest pain.  Gastrointestinal: Negative for abdominal pain, nausea and vomiting.  Genitourinary: Negative for dysuria, flank pain, frequency and urgency.  Musculoskeletal: Positive for joint pain (Right hip). Negative for back pain, falls, myalgias and neck pain.  Neurological: Negative for dizziness, tingling, sensory change, focal weakness, loss of  consciousness and headaches.  Endo/Heme/Allergies: Does not bruise/bleed easily.  Psychiatric/Behavioral: Negative for depression, memory loss and substance abuse. The patient is not nervous/anxious.    Blood pressure (!) 138/97, pulse (!) 104, temperature 98.4 F (36.9 C), resp. rate 16, height 6' (1.829 m), weight 72.6 kg, SpO2 98 %. Physical Exam  Constitutional: He appears well-developed and well-nourished. No distress.  HENT:  Head: Normocephalic and atraumatic.  Eyes: Conjunctivae are normal. Right eye exhibits no discharge. Left eye exhibits no discharge. No scleral icterus.  Neck: Normal range of motion.  Cardiovascular: Normal rate and regular rhythm.  Respiratory: Effort normal. No respiratory distress.  Musculoskeletal:     Comments: RLE No traumatic wounds, ecchymosis, or rash  TTP hip  No knee or ankle effusion  Knee stable to varus/ valgus and anterior/posterior stress  Sens DPN, SPN, TN intact  Motor EHL, ext, flex, evers 5/5  DP 2+, PT 1+, No significant edema  Neurological: He is alert.  Skin: Skin is warm and dry. He is not diaphoretic.  Psychiatric: He has Todd normal mood and affect. His behavior is normal.    Assessment/Plan: Right hip fx -- Plan IMN this afternoon by Dr. Eulah PontMurphy. Please keep NPO. Multiple medical problems including HTN and afib -- per primary service    Todd CaldronMichael J. Undra Trembath, PA-C Orthopedic Surgery [phone] 06/06/2019, 9:32 AM

## 2019-06-06 NOTE — Progress Notes (Signed)
Initial Nutrition Assessment   RD working remotely.  DOCUMENTATION CODES:   Not applicable  INTERVENTION:  Once diet advances, provide Ensure Enlive po BID, each supplement provides 350 kcal and 20 grams of protein  NUTRITION DIAGNOSIS:   Increased nutrient needs related to post-op healing as evidenced by estimated needs.  GOAL:   Patient will meet greater than or equal to 90% of their needs  MONITOR:   Supplement acceptance, Diet advancement, Skin, Weight trends, Labs, I & O's  REASON FOR ASSESSMENT:   Consult Hip fracture protocol  ASSESSMENT:   78 y.o. male with PMH of CKD III, HTN presents after fall. X-rays revealed a right hip fracture.  Pt currently unavailable. Pt to undergo surgery today. RD unable to obtain pt nutrition history at this time. RD to order nutritional supplements to aid in post op healing.   Unable to complete Nutrition-Focused physical exam at this time.   Labs and medications reviewed.   Diet Order:   Diet Order            Diet NPO time specified  Diet effective midnight              EDUCATION NEEDS:   Not appropriate for education at this time  Skin:  Skin Assessment: Reviewed RN Assessment  Last BM:  9/2  Height:   Ht Readings from Last 1 Encounters:  06/06/19 6' 0.01" (1.829 m)    Weight:   Wt Readings from Last 1 Encounters:  06/06/19 75.6 kg    Ideal Body Weight:  81 kg  BMI:  Body mass index is 22.59 kg/m.  Estimated Nutritional Needs:   Kcal:  1900-2100  Protein:  90-105 grams  Fluid:  >/= 1.9 L/day    Corrin Parker, MS, RD, LDN Pager # [phone] After hours/ weekend pager # [phone]

## 2019-06-06 NOTE — Anesthesia Preprocedure Evaluation (Addendum)
Anesthesia Evaluation  Patient identified by MRN, date of birth, ID band Patient awake    Reviewed: Allergy & Precautions, H&P , NPO status , Patient's Chart, lab work & pertinent test results  Airway Mallampati: III  TM Distance: >3 FB Neck ROM: Full    Dental  (+) Dental Advisory Given, Missing, Chipped   Pulmonary neg pulmonary ROS, former smoker,    Pulmonary exam normal breath sounds clear to auscultation       Cardiovascular hypertension, Pt. on medications + dysrhythmias Atrial Fibrillation  Rhythm:Regular Rate:Normal     Neuro/Psych negative neurological ROS  negative psych ROS   GI/Hepatic negative GI ROS, Neg liver ROS,   Endo/Other  negative endocrine ROS  Renal/GU negative Renal ROS  negative genitourinary   Musculoskeletal   Abdominal   Peds  Hematology negative hematology ROS (+)   Anesthesia Other Findings   Reproductive/Obstetrics negative OB ROS                           Anesthesia Physical Anesthesia Plan  ASA: III  Anesthesia Plan: General   Post-op Pain Management:    Induction: Intravenous  PONV Risk Score and Plan: 3 and Ondansetron, Dexamethasone and Treatment may vary due to age or medical condition  Airway Management Planned: Oral ETT  Additional Equipment:   Intra-op Plan:   Post-operative Plan: Extubation in OR  Informed Consent: I have reviewed the patients History and Physical, chart, labs and discussed the procedure including the risks, benefits and alternatives for the proposed anesthesia with the patient or authorized representative who has indicated his/her understanding and acceptance.     Dental advisory given  Plan Discussed with: CRNA  Anesthesia Plan Comments:         Anesthesia Quick Evaluation

## 2019-06-06 NOTE — Transfer of Care (Signed)
Immediate Anesthesia Transfer of Care Note  Patient: Todd Bright  Procedure(s) Performed: INTRAMEDULLARY (IM) NAIL INTERTROCHANTRIC (Right Hip)  Patient Location: PACU  Anesthesia Type:General  Level of Consciousness: awake and drowsy  Airway & Oxygen Therapy: Patient Spontanous Breathing  Post-op Assessment: Report given to RN and Post -op Vital signs reviewed and stable  Post vital signs: Reviewed and stable  Last Vitals:  Vitals Value Taken Time  BP 134/96 06/06/19 1701  Temp    Pulse    Resp 19 06/06/19 1702  SpO2    Vitals shown include unvalidated device data.  Last Pain:  Vitals:   06/06/19 1225  TempSrc: Oral  PainSc:          Complications: No apparent anesthesia complications

## 2019-06-06 NOTE — Anesthesia Procedure Notes (Signed)
Procedure Name: Intubation Date/Time: 06/06/2019 3:43 PM Performed by: Harden Mo, CRNA Pre-anesthesia Checklist: Patient identified, Emergency Drugs available, Suction available and Patient being monitored Patient Re-evaluated:Patient Re-evaluated prior to induction Oxygen Delivery Method: Circle System Utilized Preoxygenation: Pre-oxygenation with 100% oxygen Induction Type: IV induction and Rapid sequence Laryngoscope Size: Miller and 2 Grade View: Grade I Tube type: Oral Tube size: 7.5 mm Number of attempts: 1 Airway Equipment and Method: Stylet and Oral airway Placement Confirmation: ETT inserted through vocal cords under direct vision,  positive ETCO2 and breath sounds checked- equal and bilateral Secured at: 23 cm Tube secured with: Tape Dental Injury: Teeth and Oropharynx as per pre-operative assessment

## 2019-06-06 NOTE — ED Notes (Signed)
Doing ok.

## 2019-06-06 NOTE — H&P (Addendum)
History and Physical  Patient Name: Todd Bright     XHB:[id number]    DOB: Sep 12, 1941    DOA: 06/05/2019 Referring provider: Dr. Percell Miller PCP: Dione Housekeeper, MD   Patient coming from: Home     Chief Complaint: Hip pain and fall  HPI: Todd Bright is a 78 y.o. male with a past medical history significant for HTN, Afib not on [facility] who presents with hip pain after a fall.  The patient was in his normal state of health until this evening when he was mowing the lawn, got off his riding mower, tripped in the weeds and landed on a decorative rock and had immediate RIGHT hip pain and could not walk and lay there for maybe an hour or more.    In the ED, the patient was afebrile, HR 90s-100s and a plain radiograph of the pelvis showed a comminuted right intertrochanteric hip fracture.  The case was discussed with Dr. Percell Miller who agreed to see the patient, and TR[facility] were asked to admit for medical management.    There was no preceding dizziness, weakness, lightheadedness or vertigo, no chest discomfort, palpitations, or dyspnea, nor are there any of those symptoms now.  The patient denies active heart issues, angina, or history of MI.  He can exert 1 equivalent of 4 mets without dyspnea.  He denies history of TIAs/CVAs, CAD, CHF, or DM treated with insulin.  He has no history of COPD or symptoms of OSA, including snoring, daytime drowsiness, apnea.  He has no history of prolonged steroid use and does not use insulin.  He does not use alcohol, and has no history of withdrawal symptoms or delirium in the context of previous surgeries.  Anesthesia Specific concerns: Presence of loose teeth: None Anesthesia problems in past: None History of bleeding disorder: None  Review of Systems:  Review of Systems  Constitutional: Negative for chills, fever and malaise/fatigue.  Respiratory: Negative for cough and shortness of breath.   Cardiovascular: Negative for chest pain, palpitations, orthopnea and leg  swelling.  All other systems reviewed and are negative.         Past Medical History:  Diagnosis Date  . Arrhythmia   . Atrial fibrillation ([place])   . Cataracts, both eyes   . CKD (chronic kidney disease), stage III ([place])   . Hypertension     Past Surgical History:  Procedure Laterality Date  . CARDIOVERSION      Social History: Patient lives with his wife.  Patient walks unassisted.  Still drives.  Former smoker. No Known Allergies  Family history: family history includes Cancer in his sister; Emphysema in his father; Heart attack in his mother.  Prior to Admission medications   Medication Sig Start Date End Date Taking? Authorizing Provider  acetaminophen (TYLENOL) 325 MG tablet Take 325-650 mg by mouth every 6 (six) hours as needed for mild pain or headache.   Yes [provider]  aspirin EC 81 MG tablet Take 81 mg by mouth daily.   Yes [provider]  diltiazem (CARDIZEM) 120 MG tablet Take 120 mg by mouth daily. 05/09/19  Yes [provider]  diphenhydrAMINE (BENADRYL) 25 MG tablet Take 25 mg by mouth at bedtime as needed (for sleep).   Yes [provider]  escitalopram (LEXAPRO) 10 MG tablet Take 10 mg by mouth daily. 05/09/19  Yes [provider]  Ibuprofen-diphenhydrAMINE Cit (MOTRIN PM) 200-38 MG TABS Take 1 tablet by mouth at bedtime as needed (for sleep).  Yes [provider]  LORazepam (ATIVAN) 0.5 MG tablet Take 0.5 mg by mouth 3 (three) times daily as needed for anxiety.    Yes [provider]  simvastatin (ZOCOR) 10 MG tablet TAKE 1 TABLET (10 MG TOTAL) DAILY BY MOUTH. PT NEEDS APPT FOR REFILLS Patient taking differently: Take 10 mg by mouth every evening.  09/06/17  Yes Newman Nip, NP  diltiazem (CARDIZEM CD) 120 MG 24 hr capsule Take 1 capsule (120 mg total) by mouth daily. Patient not taking: Reported on 06/05/2019 05/26/16   Newman Nip, NP  diltiazem (CARDIZEM) 30 MG tablet Take 1 tablet  (30 mg total) by mouth as needed. Take one tablet by mouth every 4 hours AS NEEDED for A-fib HR > 100 as long as BP > 100. Patient not taking: Reported on 06/05/2019 05/26/16   Newman Nip, NP  diltiazem (CARDIZEM) 30 MG tablet TAKE 1 TABLEY EVERY 4 HOURS AS NEEDEDE FOR A-FIB HR >100 AS LONG AS BP >100 Patient not taking: Reported on 06/05/2019 07/04/16   Newman Nip, NP  Multiple Vitamin (MULTIVITAMIN WITH MINERALS) TABS tablet Take 1 tablet by mouth daily.    [provider]  Omega-3 Fatty Acids (FISH OIL) 1000 MG CPDR Take 1,000 mg by mouth daily.    [provider]  rivaroxaban (XARELTO) 20 MG TABS tablet Take 1 tablet (20 mg total) by mouth daily with supper. Patient not taking: Reported on 06/05/2019 05/26/16   Newman Nip, NP       Physical Exam: BP (!) 150/108   Pulse (!) 111   Temp 98.4 F (36.9 C) (Oral)   Resp 18   Ht 6' (1.829 m)   Wt 72.6 kg   SpO2 98%   BMI 21.70 kg/m  General appearance: Thin elderly adult male, alert and in no acute distress.   Eyes: Anicteric, conjunctiva pink, lids and lashes normal. PERRL.    ENT: No nasal deformity, discharge, epistaxis.  Hearing normal. OP moist without lesions.  Dentition cracked tooth, otherwise normal, lips normal.    Skin: He has sunburn on the right side of his face where he was laying in the sun unable to get up.  Warm and dry.  No jaundice.  No suspicious rashes or lesions. Cardiac: Tachycardic, irregular, nl S1-S2, no murmurs appreciated.  Capillary refill is brisk.  JVP normal.  No LE edema.  Radial pulses 2+ and symmetric.   Respiratory: Normal respiratory rate and rhythm.  CTAB without rales or wheezes. GI: Abdomen soft without rigidity.  No TTP. No ascites, distension, no hepatosplenomegaly.  MSK: Right leg foreshortened and externally rotated.  No effusions.  No clubbing or cyanosis. Neuro: Sensorium intact and responding to questions, attention normal.  Speech is fluent.  Moves upper  extremities equally and with normal coordination.   Leg testing not done due to fracture. Psych: The patient is oriented to time, place and person.  Behavior appropriate.  Affect normal.  Recall, recent and remote, as well as general fund of knowledge seem within normal limits.  No evidence of aural or visual hallucinations or delusions.     Labs on Admission:  I have personally reviewed following labs and imaging studies: CBC: Recent Labs  Lab 06/05/19 1645  WBC 11.7*  NEUTROABS 10.1*  HGB 15.3  HCT 45.1  MCV 94.9  PLT 197   Basic Metabolic Panel: Recent Labs  Lab 06/05/19 1645  NA 144  K 3.9  CL 110  CO2 23  GLUCOSE 94  BUN 16  CREATININE 1.75*  CALCIUM 8.4*   GFR: Estimated Creatinine Clearance: 35.7 mL/min (A) (by C-G formula based on SCr of 1.75 mg/dL (H)).  Liver Function Tests: Recent Labs  Lab 06/05/19 1645  AST 23  ALT 15  ALKPHOS 74  BILITOT 1.4*  PROT 6.5  ALBUMIN 3.3*   No results for input(s): LIPASE, AMYLASE in the last 168 hours. No results for input(s): AMMONIA in the last 168 hours. Coagulation Profile: No results for input(s): INR, PROTIME in the last 168 hours. Cardiac Enzymes: No results for input(s): CKTOTAL, CKMB, CKMBINDEX, TROPONINI in the last 168 hours. BNP (last 3 results) No results for input(s): PROBNP in the last 8760 hours. HbA1C: No results for input(s): HGBA1C in the last 72 hours. CBG: No results for input(s): GLUCAP in the last 168 hours. Lipid Profile: No results for input(s): CHOL, HDL, LDLCALC, TRIG, CHOLHDL, LDLDIRECT in the last 72 hours. Thyroid Function Tests: No results for input(s): TSH, T4TOTAL, FREET4, T3FREE, THYROIDAB in the last 72 hours. Anemia Panel: No results for input(s): VITAMINB12, FOLATE, FERRITIN, TIBC, IRON, RETICCTPCT in the last 72 hours.    Radiological Exams on Admission: Personally reviewed chest x-ray shows no focal airspace disease or opacity; pelvis x-ray report reviewed: Dg Chest 1  View  Result Date: 06/05/2019 CLINICAL DATA:  Hip fracture, fall EXAM: CHEST  1 VIEW COMPARISON:  05/23/2016 FINDINGS: The heart is slightly enlarged. No focal opacity or pleural effusion. Bilateral skin fold artifacts. No definite pneumothorax. IMPRESSION: No active disease.  Mild cardiomegaly. Electronically Signed   By: Jasmine PangKim  Fujinaga M.D.   On: 06/05/2019 17:37   Dg Hips Bilat W Or Wo Pelvis 3-4 Views  Result Date: 06/05/2019 CLINICAL DATA:  Fall EXAM: DG HIP (WITH OR WITHOUT PELVIS) 3-4V BILAT COMPARISON:  None. FINDINGS: Right hip: Acute comminuted intertrochanteric fracture with displacement of the lesser trochanter. The right femoral head projects in joint. Left hip: No fracture or malalignment. Pubic symphysis and rami appear intact. IMPRESSION: 1. Acute comminuted right intertrochanteric fracture 2. No acute osseous abnormality of the left hip Electronically Signed   By: Jasmine PangKim  Fujinaga M.D.   On: 06/05/2019 17:37    EKG: Independently reviewed.  Rate 107, atrial fibrillation    Assessment and Plan: 1. Hip fracture: The patient will be seen by Dr. Eulah PontMurphy in the morning at [facility], to evaluate for operative fixation of the right hip.   -Admit to med-surg bed -Hydrocodone-acetaminophen or morphine as tolerated for pain -Bed rest, apply ice, document sedation and vitals per Hip fracture protocol -NPO at midnight -MIVF -Nutrition consulted     Overall, the patient is at low risk for the planned surgery.  Patient has a RCRI score of 0 (active cardiac condition, CHF, CAD, DM treated with insulin, TIA/CVA, Cr > 2.0). The patient has no active cardiac symptoms, a functional capacity >4 METs, and would be expected to be at average risk for cardiac complications from this intermediate risk procedure. -No further testing needed     2.  Chronic atrial fibrillation: RPh tech reports patient has a script for Xarelto at his pharmacy that is several years old never filled.  Otherwise unclear why he  is not on AC. Rates slightly elevated now -Continue diltiazem -PRN metoprolol IV for HR > 110  3. AKI Baseline Cr 1.1-1.4.  Tonight it is 1.75, likely from lying on the ground in the sun. -Check CK -IV fluids -Trend Cr  4. Hypertension BP normal -Continue diltiazem -  Continue aspirin, simvastatin       DVT prophylaxis: Lovenox post-op  Diet: NPO Code Status: FULL  Family Communication: Daughter by phone  Disposition Plan: Anticipate evaluation by Orthopedics and surgical fixation tomorrow, then PT evaluation and discharge to SNF in 2-3 days. Admission status: INPATIENT for hip fracture, medical surgical bed     Medical decision making and consults: Patient seen at 7:55 PM on 06/05/2019.  The patient was discussed with Dr. Deretha EmoryZackowski. What exists of the patient's chart was reviewed in depth and summarized above.  Clinical condition: stable.      Earl Liteshristopher P Elga Santy [facility]

## 2019-06-06 NOTE — ED Notes (Signed)
No pain

## 2019-06-06 NOTE — Progress Notes (Signed)
PROGRESS NOTE    Todd Bright  ZOX:[id number] DOB: May 30, 1941 DOA: 06/05/2019 PCP: Joette Catching, MD   Brief Narrative:  78 year old with history of essential hypertension, atrial fibrillation/paroxysmal not on anticoagulation came after a fall sustaining a hip fracture.  Patient also has lot of general anxiety.  Ortho plans for or 9/3 for right hip fracture.   Assessment & Plan:   Principal Problem:   Closed intertrochanteric fracture of hip, right, initial encounter [facility]) Active Problems:   Atrial fibrillation, chronic   Essential hypertension   CKD (chronic kidney disease), stage III ([facility])   AKI (acute kidney injury) ([facility])  Right hip fracture status post mechanical fall -Plans for ORIF today.  Pain control, IV fluids, bowel regimen - Supportive care, IV fluids -Check vitamin D  Ortho to recommend DVT prophylaxis and address necessary pain medications post surgery.  Atrial fibrillation with RVR, intermittent - Previously patient has been offered Xarelto by PCP but never filled it as he did not want to take anticoagulation.  Going forward will defer this to outpatient PCP - IV Lopressor now, oral Cardizem now.  Generalized anxiety - At baseline patient is quite a bit of anxiety therefore will give IV Ativan  Acute kidney injury -Admission creatinine 1.75.  Will check CK levels to rule out rhabdomyolysis -Monitor urine output.  Essential hypertension -On Cardizem  DVT prophylaxis: SCDs preoperatively Code Status: Full code Family Communication: None at bedside Disposition Plan: Maintain hospital stay for OR intervention today  Consultants:   Orthopedic  Procedures:   None so far  Antimicrobials:   None   Subjective: Patient is very anxious in general.  Reporting of right hip pain.  While I was in the room speaking with him his heart rate was irregular in 120s, but prior to me walking in was in 90s and irregular.  Review of Systems Otherwise negative  except as per HPI, including: General: Denies fever, chills, night sweats or unintended weight loss. Resp: Denies cough, wheezing, shortness of breath. Cardiac: Denies chest pain, palpitations, orthopnea, paroxysmal nocturnal dyspnea. GI: Denies abdominal pain, nausea, vomiting, diarrhea or constipation GU: Denies dysuria, frequency, hesitancy or incontinence MS: Denies muscle aches, joint pain or swelling Neuro: Denies headache, neurologic deficits (focal weakness, numbness, tingling), abnormal gait Psych: Denies anxiety, depression, SI/HI/AVH Skin: Denies new rashes or lesions ID: Denies sick contacts, exotic exposures, travel  Objective: Vitals:   06/06/19 0630 06/06/19 0700 06/06/19 0730 06/06/19 0800  BP: (!) 133/97 (!) 143/94 (!) 146/97 (!) 138/97  Pulse: (!) 104     Resp: 17 11 14 16   Temp:      TempSrc:      SpO2: 98%     Weight:      Height:        Intake/Output Summary (Last 24 hours) at 06/06/2019 1010 Last data filed at 06/06/2019 0849 Gross per 24 hour  Intake 1000.31 ml  Output -  Net 1000.31 ml   Filed Weights   06/05/19 1630  Weight: 72.6 kg    Examination:  General exam: Appears calm and comfortable  Respiratory system: Clear to auscultation. Respiratory effort normal. Cardiovascular system: S1 & S2 heard, irregularly irregular. No JVD, murmurs, rubs, gallops or clicks. No pedal edema. Gastrointestinal system: Abdomen is nondistended, soft and nontender. No organomegaly or masses felt. Normal bowel sounds heard. Central nervous system: Alert and oriented. No focal neurological deficits. Extremities: Limited range of motion of right lower extremity hip Skin: No rashes, lesions or ulcers Psychiatry: Judgement and  insight appear normal. Mood & affect appropriate.     Data Reviewed:   CBC: Recent Labs  Lab 06/05/19 1645  WBC 11.7*  NEUTROABS 10.1*  HGB 15.3  HCT 45.1  MCV 94.9  PLT 161   Basic Metabolic Panel: Recent Labs  Lab 06/05/19 1645   NA 144  K 3.9  CL 110  CO2 23  GLUCOSE 94  BUN 16  CREATININE 1.75*  CALCIUM 8.4*   GFR: Estimated Creatinine Clearance: 35.7 mL/min (A) (by C-G formula based on SCr of 1.75 mg/dL (H)). Liver Function Tests: Recent Labs  Lab 06/05/19 1645  AST 23  ALT 15  ALKPHOS 74  BILITOT 1.4*  PROT 6.5  ALBUMIN 3.3*   No results for input(s): LIPASE, AMYLASE in the last 168 hours. No results for input(s): AMMONIA in the last 168 hours. Coagulation Profile: No results for input(s): INR, PROTIME in the last 168 hours. Cardiac Enzymes: No results for input(s): CKTOTAL, CKMB, CKMBINDEX, TROPONINI in the last 168 hours. BNP (last 3 results) No results for input(s): PROBNP in the last 8760 hours. HbA1C: No results for input(s): HGBA1C in the last 72 hours. CBG: No results for input(s): GLUCAP in the last 168 hours. Lipid Profile: No results for input(s): CHOL, HDL, LDLCALC, TRIG, CHOLHDL, LDLDIRECT in the last 72 hours. Thyroid Function Tests: Recent Labs    06/06/19 0808  TSH 1.988   Anemia Panel: No results for input(s): VITAMINB12, FOLATE, FERRITIN, TIBC, IRON, RETICCTPCT in the last 72 hours. Sepsis Labs: No results for input(s): PROCALCITON, LATICACIDVEN in the last 168 hours.  Recent Results (from the past 240 hour(s))  SARS Coronavirus 2 [facility] order, Performed in [facility] hospital lab) Nasopharyngeal Nasopharyngeal Swab     Status: None   Collection Time: 06/05/19  6:27 PM   Specimen: Nasopharyngeal Swab  Result Value Ref Range Status   SARS Coronavirus 2 NEGATIVE NEGATIVE Final    Comment: (NOTE) If result is NEGATIVE SARS-CoV-2 target nucleic acids are NOT DETECTED. The SARS-CoV-2 RNA is generally detectable in upper and lower  respiratory specimens during the acute phase of infection. The lowest  concentration of SARS-CoV-2 viral copies this assay can detect is 250  copies / mL. A negative result does not preclude SARS-CoV-2 infection  and should not be used  as the sole basis for treatment or other  patient management decisions.  A negative result may occur with  improper specimen collection / handling, submission of specimen other  than nasopharyngeal swab, presence of viral mutation(s) within the  areas targeted by this assay, and inadequate number of viral copies  (<250 copies / mL). A negative result must be combined with clinical  observations, patient history, and epidemiological information. If result is POSITIVE SARS-CoV-2 target nucleic acids are DETECTED. The SARS-CoV-2 RNA is generally detectable in upper and lower  respiratory specimens dur ing the acute phase of infection.  Positive  results are indicative of active infection with SARS-CoV-2.  Clinical  correlation with patient history and other diagnostic information is  necessary to determine patient infection status.  Positive results do  not rule out bacterial infection or co-infection with other viruses. If result is PRESUMPTIVE POSTIVE SARS-CoV-2 nucleic acids MAY BE PRESENT.   A presumptive positive result was obtained on the submitted specimen  and confirmed on repeat testing.  While 2019 novel coronavirus  (SARS-CoV-2) nucleic acids may be present in the submitted sample  additional confirmatory testing may be necessary for epidemiological  and / or clinical  management purposes  to differentiate between  SARS-CoV-2 and other Sarbecovirus currently known to infect humans.  If clinically indicated additional testing with an alternate test  methodology [phone]([id number]) is advised. The SARS-CoV-2 RNA is generally  detectable in upper and lower respiratory sp ecimens during the acute  phase of infection. The expected result is Negative. Fact Sheet for Patients:  BoilerBrush.com.cyhttps://www.fda.gov/media/136312/download Fact Sheet for Healthcare Providers: https://pope.com/https://www.fda.gov/media/136313/download This test is not yet approved or cleared by the [place] FDA and has been authorized for  detection and/or diagnosis of SARS-CoV-2 by FDA under an Emergency Use Authorization (EUA).  This EUA will remain in effect (meaning this test can be used) for the duration of the COVID-19 declaration under Section 564(b)(1) of the Act, 21 U.S.C. section 360bbb-3(b)(1), unless the authorization is terminated or revoked sooner. Performed at [facility], [address] [place], [place] [address]          Radiology Studies: Dg Chest 1 View  Result Date: 06/05/2019 CLINICAL DATA:  Hip fracture, fall EXAM: CHEST  1 VIEW COMPARISON:  05/23/2016 FINDINGS: The heart is slightly enlarged. No focal opacity or pleural effusion. Bilateral skin fold artifacts. No definite pneumothorax. IMPRESSION: No active disease.  Mild cardiomegaly. Electronically Signed   By: Jasmine PangKim  Fujinaga M.D.   On: 06/05/2019 17:37   Dg Hips Bilat W Or Wo Pelvis 3-4 Views  Result Date: 06/05/2019 CLINICAL DATA:  Fall EXAM: DG HIP (WITH OR WITHOUT PELVIS) 3-4V BILAT COMPARISON:  None. FINDINGS: Right hip: Acute comminuted intertrochanteric fracture with displacement of the lesser trochanter. The right femoral head projects in joint. Left hip: No fracture or malalignment. Pubic symphysis and rami appear intact. IMPRESSION: 1. Acute comminuted right intertrochanteric fracture 2. No acute osseous abnormality of the left hip Electronically Signed   By: Jasmine PangKim  Fujinaga M.D.   On: 06/05/2019 17:37        Scheduled Meds: . diltiazem  120 mg Oral Daily  . LORazepam  1 mg Intravenous Once   Continuous Infusions: . sodium chloride       LOS: 1 day   Time spent= 40 mins    Domonique Brouillard Joline Maxcyhirag Gray Maugeri, MD [facility]  If 7PM-7AM, please contact night-coverage [email or web address] 06/06/2019, 10:10 AM

## 2019-06-06 NOTE — ED Notes (Signed)
ED TO INPATIENT HANDOFF REPORT  ED Nurse Name and Phone #:  S Name/Age/Gender Todd Bright 78 y.o. male Room/Bed: 053C/053C  Code Status   Code Status: Full Code  Home/SNF/Other   Triage Complete: Triage complete  Chief Complaint Hip Fx  Triage Note Pt arrived to ED from home by EMS. EMS reports pt was doing yard work and fell. Pt was laying in the sun for an unknown amount of time before a bystander found him. EMS also reported the pt's systolic BP was 100 and he was tachycardic so they gave the pt 2L of fluids and also gave 50 mcg Fentanyl for the pain in his right hip. Rt leg is shorted and rotated.Pt placed in position of comfort with bed locked and lowered, call bell in reach.     Allergies No Known Allergies  Level of Care/Admitting Diagnosis ED Disposition    ED Disposition Condition Comment   Admit  [facility]ospital [facility] [[id number]]  Level of Care: Telemetry Medical [104]  Covid Evaluation: Asymptomatic Screening Protocol (No Symptoms)  Diagnosis: Closed intertrochanteric fracture of hip, right, initial encounter [facility](HCC) [[id number]][[id number]]  Admitting Physician: Alberteen SamANFORD, CHRISTOPHER P [[id number]]  Attending Physician: Alberteen SamANFORD, CHRISTOPHER P [[id number]]  Estimated length of stay: past midnight tomorrow  Certification:: I certify this patient will need inpatient services for at least 2 midnights  PT Class (Do Not Modify): Inpatient [101]  PT Acc Code (Do Not Modify): Private [1]       B Medical/Surgery History Past Medical History:  Diagnosis Date  . Arrhythmia   . Atrial fibrillation (HCC)   . Cataracts, both eyes   . CKD (chronic kidney disease), stage III (HCC)   . Hypertension    Past Surgical History:  Procedure Laterality Date  . CARDIOVERSION       A IV Location/Drains/Wounds Patient Lines/Drains/Airways Status   Active Line/Drains/Airways    Name:   Placement date:   Placement time:   Site:   Days:   Peripheral IV 06/05/19 Right  Antecubital   06/05/19    1630    Antecubital   1          Intake/Output Last 24 hours  Intake/Output Summary (Last 24 hours) at 06/06/2019 1137 Last data filed at 06/06/2019 0849 Gross per 24 hour  Intake 1000.31 ml  Output -  Net 1000.31 ml    Labs/Imaging Results for orders placed or performed during the hospital encounter of 06/05/19 (from the past 48 hour(s))  Brain natriuretic peptide     Status: Abnormal   Collection Time: 06/05/19  4:45 PM  Result Value Ref Range   B Natriuretic Peptide 194.4 (H) 0.0 - 100.0 pg/mL    Comment: Performed at [facility], [address] [place], [place] [address]  Comprehensive metabolic panel     Status: Abnormal   Collection Time: 06/05/19  4:45 PM  Result Value Ref Range   Sodium 144 135 - 145 mmol/L   Potassium 3.9 3.5 - 5.1 mmol/L   Chloride 110 98 - 111 mmol/L   CO2 23 22 - 32 mmol/L   Glucose, Bld 94 70 - 99 mg/dL   BUN 16 8 - 23 mg/dL   Creatinine, Ser 9.621.75 (H) 0.61 - 1.24 mg/dL   Calcium 8.4 (L) 8.9 - 10.3 mg/dL   Total Protein 6.5 6.5 - 8.1 g/dL   Albumin 3.3 (L) 3.5 - 5.0 g/dL   AST 23 15 - 41 U/L   ALT 15 0 -  44 U/L   Alkaline Phosphatase 74 38 - 126 U/L   Total Bilirubin 1.4 (H) 0.3 - 1.2 mg/dL   GFR calc non Af Amer 36 (L) >60 mL/min   GFR calc Af Amer 42 (L) >60 mL/min   Anion gap 11 5 - 15    Comment: Performed at [facility], [address] [place], [place] [address]  CBC with Differential/Platelet     Status: Abnormal   Collection Time: 06/05/19  4:45 PM  Result Value Ref Range   WBC 11.7 (H) 4.0 - 10.5 K/uL   RBC 4.75 4.22 - 5.81 MIL/uL   Hemoglobin 15.3 13.0 - 17.0 g/dL   HCT 91.6 94.5 - 03.8 %   MCV 94.9 80.0 - 100.0 fL   MCH 32.2 26.0 - 34.0 pg   MCHC 33.9 30.0 - 36.0 g/dL   RDW 88.2 80.0 - 34.9 %   Platelets 197 150 - 400 K/uL   nRBC 0.0 0.0 - 0.2 %   Neutrophils Relative % 87 %   Neutro Abs 10.1 (H) 1.7 - 7.7 K/uL   Lymphocytes Relative 9 %   Lymphs Abs 1.0 0.7 - 4.0 K/uL   Monocytes  Relative 4 %   Monocytes Absolute 0.5 0.1 - 1.0 K/uL   Eosinophils Relative 0 %   Eosinophils Absolute 0.0 0.0 - 0.5 K/uL   Basophils Relative 0 %   Basophils Absolute 0.0 0.0 - 0.1 K/uL   Immature Granulocytes 0 %   Abs Immature Granulocytes 0.05 0.00 - 0.07 K/uL    Comment: Performed at [facility], [address] [place], [place] [address]  SARS Coronavirus [facility] order, Performed in [facility] hospital lab) Nasopharyngeal Nasopharyngeal Swab     Status: None   Collection Time: 06/05/19  6:27 PM   Specimen: Nasopharyngeal Swab  Result Value Ref Range   SARS Coronavirus 2 NEGATIVE NEGATIVE    Comment: (NOTE) If result is NEGATIVE SARS-CoV-2 target nucleic acids are NOT DETECTED. The SARS-CoV-2 RNA is generally detectable in upper and lower  respiratory specimens during the acute phase of infection. The lowest  concentration of SARS-CoV-2 viral copies this assay can detect is 250  copies / mL. A negative result does not preclude SARS-CoV-2 infection  and should not be used as the sole basis for treatment or other  patient management decisions.  A negative result may occur with  improper specimen collection / handling, submission of specimen other  than nasopharyngeal swab, presence of viral mutation(s) within the  areas targeted by this assay, and inadequate number of viral copies  (<250 copies / mL). A negative result must be combined with clinical  observations, patient history, and epidemiological information. If result is POSITIVE SARS-CoV-2 target nucleic acids are DETECTED. The SARS-CoV-2 RNA is generally detectable in upper and lower  respiratory specimens dur ing the acute phase of infection.  Positive  results are indicative of active infection with SARS-CoV-2.  Clinical  correlation with patient history and other diagnostic information is  necessary to determine patient infection status.  Positive results do  not rule out bacterial infection or  co-infection with other viruses. If result is PRESUMPTIVE POSTIVE SARS-CoV-2 nucleic acids MAY BE PRESENT.   A presumptive positive result was obtained on the submitted specimen  and confirmed on repeat testing.  While 2019 novel coronavirus  (SARS-CoV-2) nucleic acids may be present in the submitted sample  additional confirmatory testing may be necessary for epidemiological  and / or  clinical management purposes  to differentiate between  SARS-CoV-2 and other Sarbecovirus currently known to infect humans.  If clinically indicated additional testing with an alternate test  methodology [phone]) is advised. The SARS-CoV-2 RNA is generally  detectable in upper and lower respiratory sp ecimens during the acute  phase of infection. The expected result is Negative. Fact Sheet for Patients:  StrictlyIdeas.no Fact Sheet for Healthcare Providers: BankingDealers.co.za This test is not yet approved or cleared by the [place] FDA and has been authorized for detection and/or diagnosis of SARS-CoV-2 by FDA under an Emergency Use Authorization (EUA).  This EUA will remain in effect (meaning this test can be used) for the duration of the COVID-19 declaration under Section 564(b)(1) of the Act, 21 U.S.C. section 360bbb-3(b)(1), unless the authorization is terminated or revoked sooner. Performed at [facility], [place] [address] [place], [place] [address]   TSH     Status: None   Collection Time: 06/06/19  8:08 AM  Result Value Ref Range   TSH 1.988 0.350 - 4.500 uIU/mL    Comment: Performed by a 3rd Generation assay with a functional sensitivity of <=0.01 uIU/mL. Performed at [facility], [place] [address], [place], [place] [address]   Type and screen [place]     Status: None (Preliminary result)   Collection Time: 06/06/19 10:05 AM  Result Value Ref Range   ABO/RH(D) A POS    Antibody Screen PENDING    Sample  Expiration      06/09/2019,2359 Performed at [facility], [place] [address] [place], [place] [address]   CK     Status: Abnormal   Collection Time: 06/06/19 10:15 AM  Result Value Ref Range   Total CK 487 (H) 49 - 397 U/L    Comment: Performed at [facility], [place] [address], [place], [place] [address]  CBC     Status: Abnormal   Collection Time: 06/06/19 10:15 AM  Result Value Ref Range   WBC 6.8 4.0 - 10.5 K/uL   RBC 4.60 4.22 - 5.81 MIL/uL   Hemoglobin 14.8 13.0 - 17.0 g/dL   HCT 46.3 39.0 - 52.0 %   MCV 100.7 (H) 80.0 - 100.0 fL   MCH 32.2 26.0 - 34.0 pg   MCHC 32.0 30.0 - 36.0 g/dL   RDW 12.7 11.5 - 15.5 %   Platelets 176 150 - 400 K/uL   nRBC 0.0 0.0 - 0.2 %    Comment: Performed at [facility], [place] [address], [place], [place] [address]  Basic metabolic panel     Status: Abnormal (Preliminary result)   Collection Time: 06/06/19 10:15 AM  Result Value Ref Range   Sodium 134 (L) 135 - 145 mmol/L    Comment: DELTA CHECK NOTED   Potassium 4.3 3.5 - 5.1 mmol/L   Chloride 104 98 - 111 mmol/L   CO2 19 (L) 22 - 32 mmol/L   Glucose, Bld 91 70 - 99 mg/dL   BUN 15 8 - 23 mg/dL   Creatinine, Ser 1.10 0.61 - 1.24 mg/dL   Calcium PENDING 8.9 - 10.3 mg/dL   GFR calc non Af Amer >60 >60 mL/min   GFR calc Af Amer >60 >60 mL/min   Anion gap 11 5 - 15    Comment: Performed at [facility], [place] [address], [place], [place] [address]   Dg Chest 1 View  Result Date: 06/05/2019 CLINICAL DATA:  Hip  fracture, fall EXAM: CHEST  1 VIEW COMPARISON:  05/23/2016 FINDINGS: The heart is slightly enlarged. No focal opacity or pleural effusion. Bilateral skin fold artifacts. No definite pneumothorax. IMPRESSION: No active disease.  Mild cardiomegaly. Electronically Signed   By: Jasmine Pang M.D.   On: 06/05/2019 17:37   Dg Hips Bilat W Or Wo Pelvis 3-4 Views  Result Date: 06/05/2019 CLINICAL DATA:  Fall EXAM: DG HIP (WITH OR WITHOUT PELVIS) 3-4V BILAT COMPARISON:  None.  FINDINGS: Right hip: Acute comminuted intertrochanteric fracture with displacement of the lesser trochanter. The right femoral head projects in joint. Left hip: No fracture or malalignment. Pubic symphysis and rami appear intact. IMPRESSION: 1. Acute comminuted right intertrochanteric fracture 2. No acute osseous abnormality of the left hip Electronically Signed   By: Jasmine Pang M.D.   On: 06/05/2019 17:37    Pending Labs Unresulted Labs (From admission, onward)    Start     Ordered   06/12/19 0500  Creatinine, serum  (enoxaparin (LOVENOX)    CrCl >/= 30 ml/min)  Weekly,   R    Comments: while on enoxaparin therapy    06/06/19 1016   06/07/19 0500  Basic metabolic panel  Daily,   R    Question:  Specimen collection method  Answer:  Lab=Lab collect   06/06/19 0807   06/07/19 0500  CBC  Daily,   R    Question:  Specimen collection method  Answer:  Lab=Lab collect   06/06/19 0807   06/07/19 0500  Magnesium  Daily,   R    Question:  Specimen collection method  Answer:  Lab=Lab collect   06/06/19 0807   06/06/19 0813  Surgical pcr screen  Once,   STAT     06/06/19 0812   06/06/19 0808  VITAMIN D 25 Hydroxy (Vit-D Deficiency, Fractures)  Once,   STAT    Question:  Specimen collection method  Answer:  Lab=Lab collect   06/06/19 0807          Vitals/Pain Today's Vitals   06/06/19 0825 06/06/19 1030 06/06/19 1030 06/06/19 1100  BP:    135/85  Pulse:    81  Resp:    20  Temp:      TempSrc:      SpO2:   94% 98%  Weight:      Height:      PainSc: 0-No pain 2       Isolation Precautions No active isolations  Medications Medications  aspirin EC tablet 81 mg (81 mg Oral Given 06/06/19 1047)  simvastatin (ZOCOR) tablet 10 mg (has no administration in time range)  HYDROcodone-acetaminophen (NORCO/VICODIN) 5-325 MG per tablet 1-2 tablet (1 tablet Oral Given 06/06/19 0903)  morphine 2 MG/ML injection 0.5 mg (has no administration in time range)  docusate sodium (COLACE) capsule 100 mg  (has no administration in time range)  polyethylene glycol (MIRALAX / GLYCOLAX) packet 17 g (has no administration in time range)  bisacodyl (DULCOLAX) suppository 10 mg (has no administration in time range)  enoxaparin (LOVENOX) injection 40 mg (has no administration in time range)  0.9 %  sodium chloride infusion (has no administration in time range)  metoprolol tartrate (LOPRESSOR) injection 5 mg (has no administration in time range)  senna-docusate (Senokot-S) tablet 2 tablet (has no administration in time range)  0.9 %  sodium chloride infusion ( Intravenous Not Given 06/06/19 1029)  diltiazem (CARDIZEM CD) 24 hr capsule 120 mg (120 mg Oral Given 06/06/19 1047)  escitalopram (LEXAPRO)  tablet 10 mg (has no administration in time range)  LORazepam (ATIVAN) tablet 0.5 mg (has no administration in time range)  diltiazem (CARDIZEM) tablet 30 mg (30 mg Oral Given 06/05/19 2221)  LORazepam (ATIVAN) injection 1 mg (1 mg Intravenous Given 06/06/19 1047)    Mobility  High fall risk    Focused Assessments    R Recommendations: See Admitting Provider Note  Report given to:   Additional Notes:

## 2019-06-06 NOTE — H&P (View-Only) (Signed)
Reason for Consult:Right hip fx Referring Physician: A Todd Bright is an 78 y.o. male.  HPI: Todd Bright was out in the garden and slipped and fell. He hit his right hip on a rock when he fell and had immediate pain. He could not get up and was brought to the ED. X-rays showed a right hip fx and orthopedic surgery was consulted. He is very active and lives at home with his demented wife, for whom he cares, and his daughter.  Past Medical History:  Diagnosis Date  . Arrhythmia   . Atrial fibrillation ([place])   . Cataracts, both eyes   . CKD (chronic kidney disease), stage III ([place])   . Hypertension     Past Surgical History:  Procedure Laterality Date  . CARDIOVERSION      Family History  Problem Relation Age of Onset  . Heart attack Mother   . Emphysema Father   . Cancer Sister     Social History:  reports that he quit smoking about 4 years ago. He has never used smokeless tobacco. He reports that he does not drink alcohol or use drugs.  Allergies: No Known Allergies  Medications: I have reviewed the patient's current medications.  Results for orders placed or performed during the hospital encounter of 06/05/19 (from the past 48 hour(s))  Brain natriuretic peptide     Status: Abnormal   Collection Time: 06/05/19  4:45 PM  Result Value Ref Range   B Natriuretic Peptide 194.4 (H) 0.0 - 100.0 pg/mL    Comment: Performed at [facility], [address] [place], [place] [address]  Comprehensive metabolic panel     Status: Abnormal   Collection Time: 06/05/19  4:45 PM  Result Value Ref Range   Sodium 144 135 - 145 mmol/L   Potassium 3.9 3.5 - 5.1 mmol/L   Chloride 110 98 - 111 mmol/L   CO2 23 22 - 32 mmol/L   Glucose, Bld 94 70 - 99 mg/dL   BUN 16 8 - 23 mg/dL   Creatinine, Ser 1.75 (H) 0.61 - 1.24 mg/dL   Calcium 8.4 (L) 8.9 - 10.3 mg/dL   Total Protein 6.5 6.5 - 8.1 g/dL   Albumin 3.3 (L) 3.5 - 5.0 g/dL   AST 23 15 - 41 U/L   ALT 15 0 - 44 U/L   Alkaline  Phosphatase 74 38 - 126 U/L   Total Bilirubin 1.4 (H) 0.3 - 1.2 mg/dL   GFR calc non Af Amer 36 (L) >60 mL/min   GFR calc Af Amer 42 (L) >60 mL/min   Anion gap 11 5 - 15    Comment: Performed at [place] [address] [place], [place] [address]  CBC with Differential/Platelet     Status: Abnormal   Collection Time: 06/05/19  4:45 PM  Result Value Ref Range   WBC 11.7 (H) 4.0 - 10.5 K/uL   RBC 4.75 4.22 - 5.81 MIL/uL   Hemoglobin 15.3 13.0 - 17.0 g/dL   HCT 45.1 39.0 - 52.0 %   MCV 94.9 80.0 - 100.0 fL   MCH 32.2 26.0 - 34.0 pg   MCHC 33.9 30.0 - 36.0 g/dL   RDW 12.7 11.5 - 15.5 %   Platelets 197 150 - 400 K/uL   nRBC 0.0 0.0 - 0.2 %   Neutrophils Relative % 87 %   Neutro Abs 10.1 (H) 1.7 - 7.7 K/uL   Lymphocytes Relative 9 %  Lymphs Abs 1.0 0.7 - 4.0 K/uL   Monocytes Relative 4 %   Monocytes Absolute 0.5 0.1 - 1.0 K/uL   Eosinophils Relative 0 %   Eosinophils Absolute 0.0 0.0 - 0.5 K/uL   Basophils Relative 0 %   Basophils Absolute 0.0 0.0 - 0.1 K/uL   Immature Granulocytes 0 %   Abs Immature Granulocytes 0.05 0.00 - 0.07 K/uL    Comment: Performed at [facility], [address] [place], [place] [address]  SARS Coronavirus 2 [facility] order, Performed in [facility] hospital lab) Nasopharyngeal Nasopharyngeal Swab     Status: None   Collection Time: 06/05/19  6:27 PM   Specimen: Nasopharyngeal Swab  Result Value Ref Range   SARS Coronavirus 2 NEGATIVE NEGATIVE    Comment: (NOTE) If result is NEGATIVE SARS-CoV-2 target nucleic acids are NOT DETECTED. The SARS-CoV-2 RNA is generally detectable in upper and lower  respiratory specimens during the acute phase of infection. The lowest  concentration of SARS-CoV-2 viral copies this assay can detect is 250  copies / mL. A negative result does not preclude SARS-CoV-2 infection  and should not be used as the sole basis for treatment or other  patient management decisions.  A negative result may occur with   improper specimen collection / handling, submission of specimen other  than nasopharyngeal swab, presence of viral mutation(s) within the  areas targeted by this assay, and inadequate number of viral copies  (<250 copies / mL). A negative result must be combined with clinical  observations, patient history, and epidemiological information. If result is POSITIVE SARS-CoV-2 target nucleic acids are DETECTED. The SARS-CoV-2 RNA is generally detectable in upper and lower  respiratory specimens dur ing the acute phase of infection.  Positive  results are indicative of active infection with SARS-CoV-2.  Clinical  correlation with patient history and other diagnostic information is  necessary to determine patient infection status.  Positive results do  not rule out bacterial infection or co-infection with other viruses. If result is PRESUMPTIVE POSTIVE SARS-CoV-2 nucleic acids MAY BE PRESENT.   A presumptive positive result was obtained on the submitted specimen  and confirmed on repeat testing.  While 2019 novel coronavirus  (SARS-CoV-2) nucleic acids may be present in the submitted sample  additional confirmatory testing may be necessary for epidemiological  and / or clinical management purposes  to differentiate between  SARS-CoV-2 and other Sarbecovirus currently known to infect humans.  If clinically indicated additional testing with an alternate test  methodology [phone]) is advised. The SARS-CoV-2 RNA is generally  detectable in upper and lower respiratory sp ecimens during the acute  phase of infection. The expected result is Negative. Fact Sheet for Patients:  BoilerBrush.com.cy Fact Sheet for Healthcare Providers: https://pope.com/ This test is not yet approved or cleared by the [place] FDA and has been authorized for detection and/or diagnosis of SARS-CoV-2 by FDA under an Emergency Use Authorization (EUA).  This EUA will  remain in effect (meaning this test can be used) for the duration of the COVID-19 declaration under Section 564(b)(1) of the Act, 21 U.S.C. section 360bbb-3(b)(1), unless the authorization is terminated or revoked sooner. Performed at [facility], [address], [place], [place] [address]     Dg Chest 1 View  Result Date: 06/05/2019 CLINICAL DATA:  Hip fracture, fall EXAM: CHEST  1 VIEW COMPARISON:  05/23/2016 FINDINGS: The heart is slightly enlarged. No focal opacity or pleural effusion. Bilateral skin fold artifacts. No definite  pneumothorax. IMPRESSION: No active disease.  Mild cardiomegaly. Electronically Signed   By: Jasmine PangKim  Fujinaga M.D.   On: 06/05/2019 17:37   Dg Hips Bilat W Or Wo Pelvis 3-4 Views  Result Date: 06/05/2019 CLINICAL DATA:  Fall EXAM: DG HIP (WITH OR WITHOUT PELVIS) 3-4V BILAT COMPARISON:  None. FINDINGS: Right hip: Acute comminuted intertrochanteric fracture with displacement of the lesser trochanter. The right femoral head projects in joint. Left hip: No fracture or malalignment. Pubic symphysis and rami appear intact. IMPRESSION: 1. Acute comminuted right intertrochanteric fracture 2. No acute osseous abnormality of the left hip Electronically Signed   By: Jasmine PangKim  Fujinaga M.D.   On: 06/05/2019 17:37    Review of Systems  Constitutional: Negative for weight loss.  HENT: Negative for ear discharge, ear pain, hearing loss and tinnitus.   Eyes: Negative for blurred vision, double vision, photophobia and pain.  Respiratory: Negative for cough, sputum production and shortness of breath.   Cardiovascular: Negative for chest pain.  Gastrointestinal: Negative for abdominal pain, nausea and vomiting.  Genitourinary: Negative for dysuria, flank pain, frequency and urgency.  Musculoskeletal: Positive for joint pain (Right hip). Negative for back pain, falls, myalgias and neck pain.  Neurological: Negative for dizziness, tingling, sensory change, focal weakness, loss of  consciousness and headaches.  Endo/Heme/Allergies: Does not bruise/bleed easily.  Psychiatric/Behavioral: Negative for depression, memory loss and substance abuse. The patient is not nervous/anxious.    Blood pressure (!) 138/97, pulse (!) 104, temperature 98.4 F (36.9 C), resp. rate 16, height 6' (1.829 m), weight 72.6 kg, SpO2 98 %. Physical Exam  Constitutional: He appears well-developed and well-nourished. No distress.  HENT:  Head: Normocephalic and atraumatic.  Eyes: Conjunctivae are normal. Right eye exhibits no discharge. Left eye exhibits no discharge. No scleral icterus.  Neck: Normal range of motion.  Cardiovascular: Normal rate and regular rhythm.  Respiratory: Effort normal. No respiratory distress.  Musculoskeletal:     Comments: RLE No traumatic wounds, ecchymosis, or rash  TTP hip  No knee or ankle effusion  Knee stable to varus/ valgus and anterior/posterior stress  Sens DPN, SPN, TN intact  Motor EHL, ext, flex, evers 5/5  DP 2+, PT 1+, No significant edema  Neurological: He is alert.  Skin: Skin is warm and dry. He is not diaphoretic.  Psychiatric: He has a normal mood and affect. His behavior is normal.    Assessment/Plan: Right hip fx -- Plan IMN this afternoon by Dr. Eulah PontMurphy. Please keep NPO. Multiple medical problems including HTN and afib -- per primary service    Freeman CaldronMichael J. Kimoni Pickerill, PA-C Orthopedic Surgery [phone] 06/06/2019, 9:32 AM

## 2019-06-06 NOTE — ED Notes (Signed)
*  Colace and Laxapro not stocked in the pyxis as listed on the [facility]. Unable to give.

## 2019-06-07 ENCOUNTER — Encounter (HOSPITAL_COMMUNITY): Payer: Self-pay | Admitting: Orthopedic Surgery

## 2019-06-07 ENCOUNTER — Other Ambulatory Visit: Payer: Self-pay

## 2019-06-07 LAB — MAGNESIUM: Magnesium: 1.8 mg/dL (ref 1.7–2.4)

## 2019-06-07 LAB — BASIC METABOLIC PANEL
Anion gap: 13 (ref 5–15)
BUN: 16 mg/dL (ref 8–23)
CO2: 21 mmol/L — ABNORMAL LOW (ref 22–32)
Calcium: 8.4 mg/dL — ABNORMAL LOW (ref 8.9–10.3)
Chloride: 102 mmol/L (ref 98–111)
Creatinine, Ser: 1.14 mg/dL (ref 0.61–1.24)
GFR calc Af Amer: >60 mL/min (ref >60)
GFR calc non Af Amer: >60 mL/min (ref >60)
Glucose, Bld: 136 mg/dL — ABNORMAL HIGH (ref 70–99)
Potassium: 4.2 mmol/L (ref 3.5–5.1)
Sodium: 136 mmol/L (ref 135–145)

## 2019-06-07 LAB — CBC
HCT: 39.2 % (ref 39.0–52.0)
Hemoglobin: 13.1 g/dL (ref 13.0–17.0)
MCH: 31.6 pg (ref 26.0–34.0)
MCHC: 33.4 g/dL (ref 30.0–36.0)
MCV: 94.5 fL (ref 80.0–100.0)
Platelets: 179 10*3/uL (ref 150–400)
RBC: 4.15 MIL/uL — ABNORMAL LOW (ref 4.22–5.81)
RDW: 12.6 % (ref 11.5–15.5)
WBC: 8.9 10*3/uL (ref 4.0–10.5)
nRBC: 0 % (ref 0.0–0.2)

## 2019-06-07 LAB — VITAMIN D 25 HYDROXY (VIT D DEFICIENCY, FRACTURES): Vit D, 25-Hydroxy: 27.9 ng/mL — ABNORMAL LOW (ref 30.0–100.0)

## 2019-06-07 MED ORDER — VITAMIN D 25 MCG (1000 UNIT) PO TABS
1000.0000 [IU] | ORAL_TABLET | Freq: Every day | ORAL | Status: DC
  Administered 2019-06-07 – 2019-06-11 (×5): 1000 [IU] via ORAL
  Filled 2019-06-07 (×5): qty 1

## 2019-06-07 NOTE — Progress Notes (Signed)
Pt has been very anxious during the night. He is easily reoriented but is upset about being in the hospital. Attempted to use the [facility], but was only able to stand at the bedside. Is agreeable to using the condom catheter now. Ativan given once, with only approximately two hours of sleep resulting.

## 2019-06-07 NOTE — Social Work (Signed)
CSW left HIPAA compliant message on voicemail for pt daughter Eisa Necaise at [phone].  Westley Hummer, MSW, [place] [employer] [phone]

## 2019-06-07 NOTE — [place] FL2 (Signed)
[place] MEDICAID FL2 LEVEL OF CARE SCREENING TOOL     IDENTIFICATION  Patient Name: Todd Bright Range Birthdate: [birth year] Sex: male Admission Date (Current Location): 06/05/2019  [facility] [place]   Facility and Address:  [facility], [address], [place], [place] [address]      Provider Number: [id number]  Attending Physician Name and Address:  Dimple NanasAmin, Ankit Chirag, MD  Relative Name and Phone Number:  daughter, [phone]    Current Level of Care: Hospital Recommended Level of Care: Skilled Nursing Facility Prior Approval Number:    Date Approved/Denied:   PASRR Number: [id number] [id number]  Discharge Plan: SNF    Current Diagnoses: Patient Active Problem List   Diagnosis Date Noted  . Closed intertrochanteric fracture of hip, right, initial encounter (HCC) 06/05/2019  . Atrial fibrillation, chronic 06/05/2019  . Essential hypertension 06/05/2019  . CKD (chronic kidney disease), stage III (HCC) 06/05/2019  . AKI (acute kidney injury) (HCC) 06/05/2019    Orientation RESPIRATION BLADDER Height & Weight     Self, Place  Normal Continent Weight: 166 lb 9.8 oz (75.6 kg) Height:  6' 0.01" (182.9 cm)  BEHAVIORAL SYMPTOMS/MOOD NEUROLOGICAL BOWEL NUTRITION STATUS      Continent Diet(see discharge summary)  AMBULATORY STATUS COMMUNICATION OF NEEDS Skin   Extensive Assist Verbally Surgical wounds(incision on right hip)                       Personal Care Assistance Level of Assistance  Bathing, Feeding, Dressing Bathing Assistance: Maximum assistance Feeding assistance: Independent Dressing Assistance: Maximum assistance     Functional Limitations Info  Sight, Hearing, Speech Sight Info: Adequate Hearing Info: Impaired Speech Info: Adequate    SPECIAL CARE FACTORS FREQUENCY  PT (By licensed PT), OT (By licensed OT)     PT Frequency: 5x week OT Frequency: 5x week            Contractures Contractures Info: Not  present    Additional Factors Info  Code Status, Allergies, Psychotropic Code Status Info: Full Code Allergies Info: No Known Allergies Psychotropic Info: escitalopram (LEXAPRO) tablet 10 mg daily PO         Current Medications (06/07/2019):  This is the current hospital active medication list Current Facility-Administered Medications  Medication Dose Route Frequency Provider Last Rate Last Dose  . acetaminophen (TYLENOL) tablet 325-650 mg  325-650 mg Oral Q6H PRN Albina BilletMartensen, Henry Calvin III, PA-C      . acetaminophen (TYLENOL) tablet 500 mg  500 mg Oral Q6H Albina BilletMartensen, Henry Calvin III, PA-C   500 mg at 06/07/19 0602  . aspirin EC tablet 81 mg  81 mg Oral Daily Albina BilletMartensen, Henry Calvin III, PA-C   81 mg at 06/07/19 0913  . bisacodyl (DULCOLAX) suppository 10 mg  10 mg Rectal Daily PRN Albina BilletMartensen, Henry Calvin III, PA-C      . diltiazem (CARDIZEM CD) 24 hr capsule 120 mg  120 mg Oral Daily Albina BilletMartensen, Henry Calvin III, PA-C   120 mg at 06/07/19 0913  . docusate sodium (COLACE) capsule 100 mg  100 mg Oral BID Albina BilletMartensen, Henry Calvin III, PA-C   100 mg at 06/07/19 0913  . enoxaparin (LOVENOX) injection 40 mg  40 mg Subcutaneous Q24H Albina BilletMartensen, Henry Calvin III, PA-C   40 mg at 06/07/19 [id number]  . escitalopram (LEXAPRO) tablet 10 mg  10 mg Oral Daily Albina BilletMartensen, Henry Calvin III, PA-C   10 mg at 06/07/19 0912  . feeding  supplement (ENSURE ENLIVE) (ENSURE ENLIVE) liquid 237 mL  237 mL Oral BID BM Prudencio Burly III, PA-C   237 mL at 06/07/19 0958  . HYDROcodone-acetaminophen (NORCO/VICODIN) 5-325 MG per tablet 1-2 tablet  1-2 tablet Oral Q6H PRN Prudencio Burly III, PA-C   1 tablet at 06/07/19 0955  . HYDROmorphone (DILAUDID) injection 0.25-0.5 mg  0.25-0.5 mg Intravenous Q4H PRN Prudencio Burly III, PA-C      . LORazepam (ATIVAN) tablet 0.5 mg  0.5 mg Oral TID PRN Prudencio Burly III, PA-C   0.5 mg at 06/06/19 2206  . metoprolol tartrate (LOPRESSOR) injection 5 mg  5 mg  Intravenous Q6H PRN Prudencio Burly III, PA-C      . ondansetron (ZOFRAN) tablet 4 mg  4 mg Oral Q6H PRN Prudencio Burly III, PA-C       Or  . ondansetron (ZOFRAN) injection 4 mg  4 mg Intravenous Q6H PRN Prudencio Burly III, PA-C      . polyethylene glycol (MIRALAX / GLYCOLAX) packet 17 g  17 g Oral Daily PRN Prudencio Burly III, PA-C      . senna-docusate (Senokot-S) tablet 2 tablet  2 tablet Oral QHS PRN Prudencio Burly III, PA-C      . simvastatin (ZOCOR) tablet 10 mg  10 mg Oral QPM Prudencio Burly III, PA-C         Discharge Medications: Please see discharge summary for a list of discharge medications.  Relevant Imaging Results:  Relevant Lab Results:   Additional Information [id number] [place], [place]

## 2019-06-07 NOTE — Evaluation (Addendum)
Physical Therapy Evaluation Patient Details Name: Todd Bright [place] MRN: [id number] DOB: [birth year] Today's Date: 06/07/2019   History of Present Illness  78 y.o male who presented from home after falling in the garden with R hip fx s/p intramedullary nail intertrochanteric. PMH includes arrythmia, chronic A-fib, chronic kidney disease and hypertension.  Clinical Impression  Pt presents with an overall decrease in functional mobility secondary to above. PTA, pt was independent, caregiver for his wife with dementia. Educ on precautions, positioning, therex, and importance of mobility. Today, pt able to perform bed mobility with min assist for R LE management and take pivotal steps from bed>recliner with RW and max assist. Pt with increased anxiety and pain during mobility, limiting further ability to tolerate gait training this session . Pt would benefit from continued acute PT services to maximize functional mobility and independence prior to d/c to SNF.     Follow Up Recommendations SNF;Supervision/Assistance - 24 hour    Equipment Recommendations  Other (comment)(tbd next venue)    Recommendations for Other Services       Precautions / Restrictions Precautions Precautions: Fall Restrictions Weight Bearing Restrictions: Yes RLE Weight Bearing: Weight bearing as tolerated      Mobility  Bed Mobility Overal bed mobility: Needs Assistance Bed Mobility: Supine to Sit     Supine to sit: Min assist     General bed mobility comments: min assist for R LE management, pt uses bedrails to pull up to sitting  Transfers Overall transfer level: Needs assistance   Transfers: Sit to/from Stand Sit to Stand: Min assist;From elevated surface         General transfer comment: pt performed sit<>stand from elevated bed with min assist and RW, cues for techniques, pt very anxious with mobility  Ambulation/Gait Ambulation/Gait assistance: Max assist Gait Distance (Feet): 1 Feet Assistive  device: Rolling walker (2 wheeled) Gait Pattern/deviations: Decreased stance time - right;Decreased step length - left;Decreased weight shift to right;Trunk flexed   Gait velocity interpretation: <1.31 ft/sec, indicative of household ambulator General Gait Details: pivotal steps from bed>recliner with RW and max assist, pt anxious with mobility, trying to reach for the recliner too soon rather than completing turn with RW despite cues and reassurance from therapist      Balance Overall balance assessment: Needs assistance Sitting-balance support: Feet supported;No upper extremity supported Sitting balance-Leahy Scale: Good Sitting balance - Comments: static sitting balance with supervision, supervision during dynamic sitting balance with lateral weightshifting and scooting hips to EOB   Standing balance support: Bilateral upper extremity supported;During functional activity Standing balance-Leahy Scale: Poor Standing balance comment: mod assist for standing balance during transfer, heavy reliance on UE support with RW                             Pertinent Vitals/Pain Pain Assessment: Faces Faces Pain Scale: Hurts even more Pain Location: R hip Pain Descriptors / Indicators: Aching;Guarding;Grimacing Pain Intervention(s): Limited activity within patient's tolerance;Monitored during session;Repositioned    Home Living Family/patient expects to be discharged to:: Private residence Living Arrangements: Spouse/significant other Available Help at Discharge: Family Type of Home: House Home Access: Stairs to enter Entrance Stairs-Rails: None Entrance Stairs-Number of Steps: 1 step onto front porch Home Layout: Laundry or work area in basement;Able to live on main level with bedroom/bathroom;Two level Home Equipment: Walker - 2 wheels      Prior Function Level of Independence: Independent  Comments: independent, performs yardwork. lives with wife who has  dementia.     Hand Dominance        Extremity/Trunk Assessment        Lower Extremity Assessment Lower Extremity Assessment: RLE deficits/detail RLE Deficits / Details: R LE strength impaired, able to slide LE across bed minimally, unable to lift against gravity RLE: Unable to fully assess due to pain RLE Coordination: decreased gross motor(coordination impaired secondary to pain and fx)    Cervical / Trunk Assessment Cervical / Trunk Assessment: Kyphotic  Communication      Cognition Arousal/Alertness: Awake/alert Behavior During Therapy: WFL for tasks assessed/performed;Anxious(verbose and anxious with all mobility) Overall Cognitive Status: Impaired/Different from baseline Area of Impairment: Safety/judgement                         Safety/Judgement: Decreased awareness of safety     General Comments: poor safety awareness during transfer to recliner, attempting to sit before turning to chair             Assessment/Plan    PT Assessment Patient needs continued PT services  PT Problem List Decreased strength;Decreased balance;Pain;Decreased range of motion;Decreased mobility;Decreased knowledge of use of DME;Decreased activity tolerance;Decreased coordination;Decreased safety awareness       PT Treatment Interventions DME instruction;Functional mobility training;Balance training;Patient/family education;Gait training;Therapeutic activities;Neuromuscular re-education;Radiographer, therapeutic;Therapeutic exercise;Cognitive remediation    PT Goals (Current goals can be found in the Care Plan section)  Acute Rehab PT Goals Patient Stated Goal: "go home and be with my wife" PT Goal Formulation: With patient Time For Goal Achievement: 06/21/19 Potential to Achieve Goals: Fair    Frequency Min 3X/week   Barriers to discharge Inaccessible home environment;Decreased caregiver support 1 STE, lives with wife who has dementia     Co-evaluation               AM-PAC PT "6 Clicks" Mobility  Outcome Measure Help needed turning from your back to your side while in a flat bed without using bedrails?: A Little Help needed moving from lying on your back to sitting on the side of a flat bed without using bedrails?: A Little Help needed moving to and from a bed to a chair (including a wheelchair)?: A Lot Help needed standing up from a chair using your arms (e.g., wheelchair or bedside chair)?: A Little Help needed to walk in hospital room?: A Lot Help needed climbing 3-5 steps with a railing? : Total 6 Click Score: 14    End of Session Equipment Utilized During Treatment: Gait belt Activity Tolerance: Patient limited by pain;Patient tolerated treatment well Patient left: in chair;with chair alarm set;with call bell/phone within reach Nurse Communication: Mobility status PT Visit Diagnosis: Unsteadiness on feet (R26.81);Pain;Muscle weakness (generalized) (M62.81) Pain - Right/Left: Right Pain - part of body: Hip    Time: 4315-4008 PT Time Calculation (min) (ACUTE ONLY): 26 min   Charges:   PT Evaluation $PT Eval Moderate Complexity: 1 Mod PT Treatments $Therapeutic Activity: 8-22 mins        Netta Corrigan, PT, DPT Acute Rehab Office [place] 06/07/2019, 10:15 AM

## 2019-06-07 NOTE — Progress Notes (Signed)
    Subjective: Patient reports pain as mild except when mobilizing.  Tolerating diet.  Urinating.  +Flatus.  No CP, SOB.  Stood at edge of bed but has not worked with physical therapy yet.  Objective:   VITALS:   Vitals:   06/07/19 0021 06/07/19 0300 06/07/19 0336 06/07/19 0442  BP: 130/88   119/88  Pulse: (!) 114 (!) 108 (!) 102 (!) 102  Resp: 16   17  Temp: 97.6 F (36.4 C)   98.3 F (36.8 C)  TempSrc:    Oral  SpO2: 90% 95% 95% 100%  Weight:      Height:       CBC Latest Ref Rng & Units 06/07/2019 06/06/2019 06/05/2019  WBC 4.0 - 10.5 K/uL 8.9 6.8 11.7(H)  Hemoglobin 13.0 - 17.0 g/dL 13.1 14.8 15.3  Hematocrit 39.0 - 52.0 % 39.2 46.3 45.1  Platelets 150 - 400 K/uL 179 176 197   BMP Latest Ref Rng & Units 06/07/2019 06/06/2019 06/05/2019  Glucose 70 - 99 mg/dL 136(H) 91 94  BUN 8 - 23 mg/dL 16 15 16   Creatinine 0.61 - 1.24 mg/dL 1.14 1.10 1.75(H)  Sodium 135 - 145 mmol/L 136 134(L) 144  Potassium 3.5 - 5.1 mmol/L 4.2 4.3 3.9  Chloride 98 - 111 mmol/L 102 104 110  CO2 22 - 32 mmol/L 21(L) 19(L) 23  Calcium 8.9 - 10.3 mg/dL 8.4(L) 8.5(L) 8.4(L)   Intake/Output      09/03 0701 - 09/04 0700 09/04 0701 - 09/05 0700   P.O. 200    I.V. (mL/kg) 2370.1 (31.4)    IV Piggyback 100    Total Intake(mL/kg) 2670.1 (35.3)    Urine (mL/kg/hr) 450 (0.2)    Blood 100    Total Output 550    Net +2120.1         Urine Occurrence 1 x       Physical Exam: General: NAD.  Upright in bed.  Calm, talkative.  No increased work of breathing MSK RLE: Neurovascularly intact Sensation intact distally Feet warm Dorsiflexion/Plantar flexion intact Incision: dressing C/D/I   Assessment: 1 Day Post-Op  S/P Procedure(s) (LRB): INTRAMEDULLARY (IM) NAIL INTERTROCHANTRIC (Right) by Dr. Ernesta Amble. Percell Miller on 06/06/2019  Principal Problem:   Closed intertrochanteric fracture of hip, right, initial encounter [facility]) Active Problems:   Atrial fibrillation, chronic   Essential hypertension   CKD  (chronic kidney disease), stage III (HCC)   AKI (acute kidney injury) ([place])   Right intertrochanteric femur fracture, status post cephalo-medullary IM nail Doing well postop day 1 Anxious overnight- easily reoriented.  Eager to mobilize. Pain controlled Not yet mobilized with therapy-previously independently ambulatory with a walking stick.  Plan: Up with therapy Incentive Spirometry Apply ice PRN  Weightbearing: WBAT RLE Insicional and dressing care: Dressings left intact until follow-up Showering: Keep dressing dry VTE prophylaxis: Lovenox 40mg  qd 30 days, SCDs, ambulation.  Xarelto listed on home meds, but per EMR he has not taken this medicine recently. Pain control: Minimize narcotics.  Norco for severe pain. Follow - up plan: 2 weeks Contact information:  Edmonia Lynch MD, Roxan Hockey PA-C  Dispo:  TBD.  Therapy evaluations pending- previously ambulatory but high fall risk and wife with dementia.  May benefit from SNF.  Discharge when ready medically and mobilized.    Charna Elizabeth Martensen III, PA-C 06/07/2019, 7:17 AM

## 2019-06-07 NOTE — Anesthesia Postprocedure Evaluation (Signed)
Anesthesia Post Note  Patient: Todd Bright  Procedure(s) Performed: INTRAMEDULLARY (IM) NAIL INTERTROCHANTRIC (Right Hip)     Patient location during evaluation: PACU Anesthesia Type: General Level of consciousness: awake and alert Pain management: pain level controlled Vital Signs Assessment: post-procedure vital signs reviewed and stable Respiratory status: spontaneous breathing, nonlabored ventilation, respiratory function stable and patient connected to nasal cannula oxygen Cardiovascular status: blood pressure returned to baseline and stable Postop Assessment: no apparent nausea or vomiting Anesthetic complications: no    Last Vitals:  Vitals:   06/07/19 0442 06/07/19 0818  BP: 119/88 124/89  Pulse: (!) 102 81  Resp: 17 16  Temp: 36.8 C (!) 36.3 C  SpO2: 100% 99%    Last Pain:  Vitals:   06/07/19 0818  TempSrc: Oral  PainSc:                  Barnet Glasgow

## 2019-06-07 NOTE — Evaluation (Signed)
Occupational Therapy Evaluation Patient Details Name: Todd Bright MRN: [id number] DOB: 12-27-1940 Today's Date: 06/07/2019    History of Present Illness 78 y.o male who presented from home after falling in the garden with R hip fx s/p intramedullary nail intertrochanteric. PMH includes arrythmia, chronic A-fib, chronic kidney disease and hypertension.   Clinical Impression   This 78 yo male admitted and underwent above presents to acute OT with decreased mobility, increased pain, anxiousness, and decreased balance thus affecting his safety and independence with basic ADLs. PLOF pt was totally independent with basic and IADLs as well as caring for his wife who has dementia. He will benefit from acute OT with follow up at SNF.    Follow Up Recommendations  SNF;Supervision/Assistance - 24 hour    Equipment Recommendations  Other (comment)(TBD next venue)       Precautions / Restrictions Precautions Precautions: Fall Precaution Comments: monitor HR (pt gets anxious and HR goes up) was in 140's sitting EOB and went up to 180's with side step [facility], returned to 140's when back supine in bed Restrictions Weight Bearing Restrictions: No RLE Weight Bearing: Weight bearing as tolerated      Mobility Bed Mobility Overal bed mobility: Needs Assistance Bed Mobility: Supine to Sit;Sit to Supine     Supine to sit: Mod assist Sit to supine: Min assist   General bed mobility comments: A for RLE  Transfers Overall transfer level: Needs assistance Equipment used: Rolling walker (2 wheeled) Transfers: Sit to/from Stand Sit to Stand: Mod assist              Balance Overall balance assessment: Needs assistance Sitting-balance support: Feet supported;No upper extremity supported Sitting balance-Leahy Scale: Good     Standing balance support: Bilateral upper extremity supported;During functional activity Standing balance-Leahy Scale: Poor                              ADL either performed or assessed with clinical judgement   ADL Overall ADL's : Needs assistance/impaired Eating/Feeding: Independent;Sitting   Grooming: Set up;Supervision/safety;Sitting   Upper Body Bathing: Supervision/ safety;Set up;Sitting   Lower Body Bathing: Moderate assistance Lower Body Bathing Details (indicate cue type and reason): Mod A sit<>stand Upper Body Dressing : Set up;Supervision/safety;Sitting   Lower Body Dressing: Total assistance Lower Body Dressing Details (indicate cue type and reason): Mod A sit<>stand Toilet Transfer: Moderate assistance;+2 for physical assistance;RW Toilet Transfer Details (indicate cue type and reason): side step 2 steps up to [facility] Toileting- Clothing Manipulation and Hygiene: Total assistance Toileting - Clothing Manipulation Details (indicate cue type and reason): Mod A sit<>stand             Vision Patient Visual Report: No change from baseline              Pertinent Vitals/Pain Pain Assessment: Faces Faces Pain Scale: Hurts even more Pain Location: R hip--with up to edge of bed and back into bed Pain Descriptors / Indicators: Aching;Guarding;Grimacing Pain Intervention(s): Limited activity within patient's tolerance;Monitored during session;Repositioned     Hand Dominance Right   Extremity/Trunk Assessment Upper Extremity Assessment Upper Extremity Assessment: Overall WFL for tasks assessed           Communication Communication Communication: No difficulties   Cognition Arousal/Alertness: Awake/alert Behavior During Therapy: Anxious(very talkative due to this) Overall Cognitive Status: Within Functional Limits for tasks assessed  Home Living Family/patient expects to be discharged to:: Skilled nursing facility                                        Prior Functioning/Environment Level of Independence: Independent         Comments: independent, performs yardwork. lives with wife who has dementia.        OT Problem List: Decreased strength;Decreased range of motion;Impaired balance (sitting and/or standing);Decreased activity tolerance;Decreased safety awareness;Pain      OT Treatment/Interventions: Self-care/ADL training;DME and/or AE instruction;Patient/family education;Balance training    OT Goals(Current goals can be found in the care plan section) Acute Rehab OT Goals Patient Stated Goal: to go home and be able to take care of my wife OT Goal Formulation: With patient Time For Goal Achievement: 06/21/19 Potential to Achieve Goals: Good  OT Frequency: Min 2X/week   Barriers to D/C: Decreased caregiver support             AM-PAC OT "6 Clicks" Daily Activity     Outcome Measure Help from another person eating meals?: None Help from another person taking care of personal grooming?: A Little Help from another person toileting, which includes using toliet, bedpan, or urinal?: A Lot Help from another person bathing (including washing, rinsing, drying)?: A Lot Help from another person to put on and taking off regular upper body clothing?: A Little Help from another person to put on and taking off regular lower body clothing?: Total 6 Click Score: 15   End of Session Equipment Utilized During Treatment: Gait belt;Rolling walker  Activity Tolerance: (limited by HR and anxiousness) Patient left: in bed;with call bell/phone within reach;with bed alarm set  OT Visit Diagnosis: Unsteadiness on feet (R26.81);Other abnormalities of gait and mobility (R26.89);Pain Pain - Right/Left: Right Pain - part of body: Leg                Time: 7425-9563 OT Time Calculation (min): 28 min Charges:  OT General Charges $OT Visit: 1 Visit OT Evaluation $OT Eval Moderate Complexity: 1 Mod OT Treatments $Self Care/Home Management : 8-22 mins  [address], OTR/L Acute [employer] Pager  [phone] Office [phone]     Almon Register 06/07/2019, 4:08 PM

## 2019-06-07 NOTE — Progress Notes (Signed)
PROGRESS NOTE    Todd Bright  ZOX:[id number] DOB: 08-Dec-1940 DOA: 06/05/2019 PCP: Joette CatchingNyland, Leonard, MD   Brief Narrative:  78 year old with history of essential hypertension, atrial fibrillation/paroxysmal not on anticoagulation came after a fall sustaining a hip fracture.  Patient also has lot of general anxiety.  Underwent intramedullary nailing of the hip on 9/3.   Assessment & Plan:   Principal Problem:   Closed intertrochanteric fracture of hip, right, initial encounter [facility](HCC) Active Problems:   Atrial fibrillation, chronic   Essential hypertension   CKD (chronic kidney disease), stage III (HCC)   AKI (acute kidney injury) (HCC)  Right hip fracture status post mechanical fall status post intramedullary nailing of the hip on 9/3. -Tolerated the procedure well.  Pain control, bowel regimen. -PT/OT recommending rehab, case manager aware.  Ortho to recommend -Lovenox 40 mg every day for 30 days for DVT prophylaxis.  Weightbearing as tolerated on the right lower extremity.  Follow-up in 2 weeks.  Dressing to be left intact until follow-up, keep it dry.  Atrial fibrillation with RVR, intermittent - Previously offered Xarelto by PCP but never felt it.  Continue home medication.  Advised him to discuss it again with his PCP if he wants to get back on long-term anticoagulation.  Vitamin D deficiency -Supplement started  Generalized anxiety - At baseline patient is quite a bit of anxiety therefore will give IV Ativan  Acute kidney injury Mild rhabdomyolysis secondary to fall -Admission creatinine 1.75.  This is trended down to 1.14 - CK very minimally elevated.  Continue to monitor  Essential hypertension -On Cardizem  DVT prophylaxis: SCDs preoperatively Code Status: Full code Family Communication: None at bedside Disposition Plan: Maintain hospital stay until skilled nursing facility placement.   Consultants:   Orthopedic  Procedures:   None so far  Antimicrobials:    None   Subjective: Seen and examined at the bedside, sitting up on the bed.  He is currently in dilemma as his demented wife is at home but is upset that he is not able to take care of her as he is requiring help and in the hospital.  Review of Systems Otherwise negative except as per HPI, including: General = no fevers, chills, dizziness, malaise, fatigue HEENT/EYES = negative for pain, redness, loss of vision, double vision, blurred vision, loss of hearing, sore throat, hoarseness, dysphagia Cardiovascular= negative for chest pain, palpitation, murmurs, lower extremity swelling Respiratory/lungs= negative for shortness of breath, cough, hemoptysis, wheezing, mucus production Gastrointestinal= negative for nausea, vomiting,, abdominal pain, melena, hematemesis Genitourinary= negative for Dysuria, Hematuria, Change in Urinary Frequency MSK = Negative for arthralgia, myalgias, Back Pain, Joint swelling  Neurology= Negative for headache, seizures, numbness, tingling  Psychiatry= Negative for anxiety, depression, suicidal and homocidal ideation Allergy/Immunology= Medication/Food allergy as listed  Skin= Negative for Rash, lesions, ulcers, itching   Objective: Vitals:   06/07/19 0300 06/07/19 0336 06/07/19 0442 06/07/19 0818  BP:   119/88 124/89  Pulse: (!) 108 (!) 102 (!) 102 81  Resp:   17 16  Temp:   98.3 F (36.8 C) (!) 97.4 F (36.3 C)  TempSrc:   Oral Oral  SpO2: 95% 95% 100% 99%  Weight:      Height:        Intake/Output Summary (Last 24 hours) at 06/07/2019 1146 Last data filed at 06/07/2019 0300 Gross per 24 hour  Intake 1669.8 ml  Output 550 ml  Net 1119.8 ml   Filed Weights   06/05/19 1630 06/06/19 1429  Weight: 72.6 kg 75.6 kg    Examination: Constitutional: Overall appears anxious Eyes: PERRL, lids and conjunctivae normal ENMT: Mucous membranes are moist. Posterior pharynx clear of any exudate or lesions.Normal dentition.  Neck: normal, supple, no masses,  no thyromegaly Respiratory: clear to auscultation bilaterally, no wheezing, no crackles. Normal respiratory effort. No accessory muscle use.  Cardiovascular: Irregularly irregular, no murmurs / rubs / gallops. No extremity edema. 2+ pedal pulses. No carotid bruits.  Abdomen: no tenderness, no masses palpated. No hepatosplenomegaly. Bowel sounds positive.  Musculoskeletal: no clubbing / cyanosis. No joint deformity upper and lower extremities. Good ROM, no contractures. Normal muscle tone.  Skin: Hip dressing noted with any evidence of bleeding Neurologic: CN 2-12 grossly intact. Sensation intact, DTR normal. Strength 4/5 in all 4.  Psychiatric: Normal judgment and insight. Alert and oriented x 3. Normal mood.     Data Reviewed:   CBC: Recent Labs  Lab 06/05/19 1645 06/06/19 1015 06/07/19 0404  WBC 11.7* 6.8 8.9  NEUTROABS 10.1*  --   --   HGB 15.3 14.8 13.1  HCT 45.1 46.3 39.2  MCV 94.9 100.7* 94.5  PLT 197 176 932   Basic Metabolic Panel: Recent Labs  Lab 06/05/19 1645 06/06/19 1015 06/07/19 0404  NA 144 134* 136  K 3.9 4.3 4.2  CL 110 104 102  CO2 23 19* 21*  GLUCOSE 94 91 136*  BUN 16 15 16   CREATININE 1.75* 1.10 1.14  CALCIUM 8.4* 8.5* 8.4*  MG  --   --  1.8   GFR: Estimated Creatinine Clearance: 57.1 mL/min (by C-G formula based on SCr of 1.14 mg/dL). Liver Function Tests: Recent Labs  Lab 06/05/19 1645  AST 23  ALT 15  ALKPHOS 74  BILITOT 1.4*  PROT 6.5  ALBUMIN 3.3*   No results for input(s): LIPASE, AMYLASE in the last 168 hours. No results for input(s): AMMONIA in the last 168 hours. Coagulation Profile: No results for input(s): INR, PROTIME in the last 168 hours. Cardiac Enzymes: Recent Labs  Lab 06/06/19 1015  CKTOTAL 487*   BNP (last 3 results) No results for input(s): PROBNP in the last 8760 hours. HbA1C: No results for input(s): HGBA1C in the last 72 hours. CBG: No results for input(s): GLUCAP in the last 168 hours. Lipid Profile:  No results for input(s): CHOL, HDL, LDLCALC, TRIG, CHOLHDL, LDLDIRECT in the last 72 hours. Thyroid Function Tests: Recent Labs    06/06/19 0808  TSH 1.988   Anemia Panel: No results for input(s): VITAMINB12, FOLATE, FERRITIN, TIBC, IRON, RETICCTPCT in the last 72 hours. Sepsis Labs: No results for input(s): PROCALCITON, LATICACIDVEN in the last 168 hours.  Recent Results (from the past 240 hour(s))  SARS Coronavirus 2 [facility] order, Performed in [facility] hospital lab) Nasopharyngeal Nasopharyngeal Swab     Status: None   Collection Time: 06/05/19  6:27 PM   Specimen: Nasopharyngeal Swab  Result Value Ref Range Status   SARS Coronavirus 2 NEGATIVE NEGATIVE Final    Comment: (NOTE) If result is NEGATIVE SARS-CoV-2 target nucleic acids are NOT DETECTED. The SARS-CoV-2 RNA is generally detectable in upper and lower  respiratory specimens during the acute phase of infection. The lowest  concentration of SARS-CoV-2 viral copies this assay can detect is 250  copies / mL. A negative result does not preclude SARS-CoV-2 infection  and should not be used as the sole basis for treatment or other  patient management decisions.  A negative result may occur with  improper specimen  collection / handling, submission of specimen other  than nasopharyngeal swab, presence of viral mutation(s) within the  areas targeted by this assay, and inadequate number of viral copies  (<250 copies / mL). A negative result must be combined with clinical  observations, patient history, and epidemiological information. If result is POSITIVE SARS-CoV-2 target nucleic acids are DETECTED. The SARS-CoV-2 RNA is generally detectable in upper and lower  respiratory specimens dur ing the acute phase of infection.  Positive  results are indicative of active infection with SARS-CoV-2.  Clinical  correlation with patient history and other diagnostic information is  necessary to determine patient infection status.   Positive results do  not rule out bacterial infection or co-infection with other viruses. If result is PRESUMPTIVE POSTIVE SARS-CoV-2 nucleic acids MAY BE PRESENT.   A presumptive positive result was obtained on the submitted specimen  and confirmed on repeat testing.  While 2019 novel coronavirus  (SARS-CoV-2) nucleic acids may be present in the submitted sample  additional confirmatory testing may be necessary for epidemiological  and / or clinical management purposes  to differentiate between  SARS-CoV-2 and other Sarbecovirus currently known to infect humans.  If clinically indicated additional testing with an alternate test  methodology [phone]) is advised. The SARS-CoV-2 RNA is generally  detectable in upper and lower respiratory sp ecimens during the acute  phase of infection. The expected result is Negative. Fact Sheet for Patients:  BoilerBrush.com.cyhttps://www.fda.gov/media/136312/download Fact Sheet for Healthcare Providers: https://pope.com/https://www.fda.gov/media/136313/download This test is not yet approved or cleared by the [place] FDA and has been authorized for detection and/or diagnosis of SARS-CoV-2 by FDA under an Emergency Use Authorization (EUA).  This EUA will remain in effect (meaning this test can be used) for the duration of the COVID-19 declaration under Section 564(b)(1) of the Act, 21 U.S.C. section 360bbb-3(b)(1), unless the authorization is terminated or revoked sooner. Performed at [facility], [address] [place], [place] [address]   Surgical pcr screen     Status: None   Collection Time: 06/06/19 12:28 PM   Specimen: Nasal Mucosa; Nasal Swab  Result Value Ref Range Status   MRSA, PCR NEGATIVE NEGATIVE Final   Staphylococcus aureus NEGATIVE NEGATIVE Final    Comment: (NOTE) The Xpert SA Assay (FDA approved for NASAL specimens in patients 79 years of age and older), is one component of a comprehensive surveillance program. It is not intended to diagnose  infection nor to guide or monitor treatment. Performed at [facility], [address] [place], [place] [address]          Radiology Studies: Dg Chest 1 View  Result Date: 06/05/2019 CLINICAL DATA:  Hip fracture, fall EXAM: CHEST  1 VIEW COMPARISON:  05/23/2016 FINDINGS: The heart is slightly enlarged. No focal opacity or pleural effusion. Bilateral skin fold artifacts. No definite pneumothorax. IMPRESSION: No active disease.  Mild cardiomegaly. Electronically Signed   By: Jasmine PangKim  Fujinaga M.D.   On: 06/05/2019 17:37   Dg C-arm 1-60 Min  Result Date: 06/06/2019 CLINICAL DATA:  Right hip fracture EXAM: RIGHT FEMUR 2 VIEWS; DG C-ARM 1-60 MIN COMPARISON:  06/05/2019 FINDINGS: Four low resolution intraoperative spot views of the right femur. Total fluoroscopy time was 50 seconds. The images demonstrate intramedullary rod fixation of the right femur for comminuted intertrochanteric fracture. Displaced lesser trochanteric fracture fragment is noted. IMPRESSION: Intraoperative fluoroscopic assistance provided during surgical fixation of right intertrochanteric fracture Electronically Signed   By: Jasmine PangKim  Fujinaga M.D.   On:  06/06/2019 16:46   Dg Femur, Min 2 Views Right  Result Date: 06/06/2019 CLINICAL DATA:  Right hip fracture EXAM: RIGHT FEMUR 2 VIEWS; DG C-ARM 1-60 MIN COMPARISON:  06/05/2019 FINDINGS: Four low resolution intraoperative spot views of the right femur. Total fluoroscopy time was 50 seconds. The images demonstrate intramedullary rod fixation of the right femur for comminuted intertrochanteric fracture. Displaced lesser trochanteric fracture fragment is noted. IMPRESSION: Intraoperative fluoroscopic assistance provided during surgical fixation of right intertrochanteric fracture Electronically Signed   By: Jasmine Pang M.D.   On: 06/06/2019 16:46   Dg Hips Bilat W Or Wo Pelvis 3-4 Views  Result Date: 06/05/2019 CLINICAL DATA:  Fall EXAM: DG HIP (WITH OR WITHOUT PELVIS) 3-4V BILAT  COMPARISON:  None. FINDINGS: Right hip: Acute comminuted intertrochanteric fracture with displacement of the lesser trochanter. The right femoral head projects in joint. Left hip: No fracture or malalignment. Pubic symphysis and rami appear intact. IMPRESSION: 1. Acute comminuted right intertrochanteric fracture 2. No acute osseous abnormality of the left hip Electronically Signed   By: Jasmine Pang M.D.   On: 06/05/2019 17:37        Scheduled Meds: . acetaminophen  500 mg Oral Q6H  . aspirin EC  81 mg Oral Daily  . diltiazem  120 mg Oral Daily  . docusate sodium  100 mg Oral BID  . enoxaparin (LOVENOX) injection  40 mg Subcutaneous Q24H  . escitalopram  10 mg Oral Daily  . feeding supplement (ENSURE ENLIVE)  237 mL Oral BID BM  . simvastatin  10 mg Oral QPM   Continuous Infusions:    LOS: 2 days   Time spent= 35 mins     Joline Maxcy, MD [facility]  If 7PM-7AM, please contact night-coverage [email or web address] 06/07/2019, 11:46 AM

## 2019-06-08 LAB — CBC
HCT: 32.4 % — ABNORMAL LOW (ref 39.0–52.0)
Hemoglobin: 11.2 g/dL — ABNORMAL LOW (ref 13.0–17.0)
MCH: 32.2 pg (ref 26.0–34.0)
MCHC: 34.6 g/dL (ref 30.0–36.0)
MCV: 93.1 fL (ref 80.0–100.0)
Platelets: 163 10*3/uL (ref 150–400)
RBC: 3.48 MIL/uL — ABNORMAL LOW (ref 4.22–5.81)
RDW: 12.6 % (ref 11.5–15.5)
WBC: 12.2 10*3/uL — ABNORMAL HIGH (ref 4.0–10.5)
nRBC: 0 % (ref 0.0–0.2)

## 2019-06-08 LAB — MAGNESIUM: Magnesium: 1.9 mg/dL (ref 1.7–2.4)

## 2019-06-08 LAB — BASIC METABOLIC PANEL
Anion gap: 11 (ref 5–15)
BUN: 21 mg/dL (ref 8–23)
CO2: 23 mmol/L (ref 22–32)
Calcium: 8.5 mg/dL — ABNORMAL LOW (ref 8.9–10.3)
Chloride: 100 mmol/L (ref 98–111)
Creatinine, Ser: 1.07 mg/dL (ref 0.61–1.24)
GFR calc Af Amer: >60 mL/min (ref >60)
GFR calc non Af Amer: >60 mL/min (ref >60)
Glucose, Bld: 94 mg/dL (ref 70–99)
Potassium: 4.2 mmol/L (ref 3.5–5.1)
Sodium: 134 mmol/L — ABNORMAL LOW (ref 135–145)

## 2019-06-08 MED ORDER — SENNOSIDES-DOCUSATE SODIUM 8.6-50 MG PO TABS: 2.0000 | ORAL_TABLET | Freq: Every evening | ORAL | Status: AC | PRN

## 2019-06-08 MED ORDER — BISACODYL 10 MG RE SUPP: 10.0000 mg | Freq: Every day | RECTAL | 0 refills | Status: DC | PRN

## 2019-06-08 MED ORDER — POLYETHYLENE GLYCOL 3350 17 G PO PACK: 17.0000 g | PACK | Freq: Every day | ORAL | 0 refills | Status: AC | PRN

## 2019-06-08 MED ORDER — CYCLOBENZAPRINE HCL 10 MG PO TABS
5.0000 mg | ORAL_TABLET | Freq: Three times a day (TID) | ORAL | Status: DC | PRN
  Administered 2019-06-08 – 2019-06-09 (×3): 5 mg via ORAL
  Filled 2019-06-08 (×3): qty 1

## 2019-06-08 MED ORDER — LORAZEPAM 0.5 MG PO TABS: 0.5000 mg | ORAL_TABLET | Freq: Three times a day (TID) | ORAL | 0 refills | Status: AC | PRN

## 2019-06-08 NOTE — TOC Initial Note (Signed)
Transition of Care [facility]) - Initial/Assessment Note    Patient Details  Name: Todd Bright MRN: [id number] Date of Birth: 1941-05-27  [facility]) CM/SW Contact:    Alexander [place], [place] Phone Number: 06/08/2019, 9:56 AM  Clinical Narrative:                 CSW spoke with pt and completed telephonic assessment. Introduced self, role, reason for call. Pt more oriented today; from home with his wife who has dementia and it sounds like he provides care for. Pt has an adult daughter Webb Silversmith who is currently assisting pt spouse. Pt in agreement that he needs rehab and prefers [facility]. Pt voiced agreement with CSW attempting to reach CSW. Pt daughter did not answer phone; CSW left an additional message and HIPAA compliant text message requesting a return call. CSW has faxed out pt referral.   Expected Discharge Plan: Skilled Nursing Facility Barriers to Discharge: Continued Medical Work up   Patient Goals and CMS Choice Patient states their goals for this hospitalization and ongoing recovery are:: to get some rehab and some help for his wife CMS Medicare.gov Compare Post Acute Care list provided to:: Patient Choice offered to / list presented to : Patient  Expected Discharge Plan and Services Expected Discharge Plan: [place] In-house Referral: Clinical Social Work Discharge Planning Services: CM Consult Post Acute Care Choice: [place] Living arrangements for the past 2 months: [place] Expected Discharge Date: 06/08/19                Prior Living Arrangements/Services Living arrangements for the past 2 months: [place] Lives with:: Spouse Patient language and need for interpreter reviewed:: Yes(no needs) Do you feel safe going back to the place where you live?: Yes      Need for Family Participation in Patient Care: Yes (Comment)(assistance with ADL and IADLs; supervision; assist with wife) Care giver  support system in place?: Yes (comment)(adult daughter) Current home services: DME Criminal Activity/Legal Involvement Pertinent to Current Situation/Hospitalization: No - Comment as needed  Activities of Daily Living Home Assistive Devices/Equipment: Cane (specify quad or straight) ADL Screening (condition at time of admission) Patient's cognitive ability adequate to safely complete daily activities?: Yes Is the patient deaf or have difficulty hearing?: Yes Does the patient have difficulty seeing, even when wearing glasses/contacts?: No Does the patient have difficulty concentrating, remembering, or making decisions?: Yes Patient able to express need for assistance with ADLs?: Yes Does the patient have difficulty dressing or bathing?: No Independently performs ADLs?: Yes (appropriate for developmental age) Does the patient have difficulty walking or climbing stairs?: Yes Weakness of Legs: None Weakness of Arms/Hands: None  Permission Sought/Granted Permission sought to share information with : Facility Sport and exercise psychologist, Family Supports Permission granted to share information with : Yes, Verbal Permission Granted  Share Information with NAME: Decker Cogdell  Permission granted to share info w AGENCY: SNFs  Permission granted to share info w Relationship: daughter  Permission granted to share info w Contact Information: [phone]  Emotional Assessment Appearance:: Other (Comment Required(telephonic assessment) Attitude/Demeanor/Rapport: (telephonic assessment) Affect (typically observed): (telephonic assessment) Orientation: : Oriented to Self, Oriented to  Time, Oriented to Situation Alcohol / Substance Use: Not Applicable Psych Involvement: No (comment)  Admission diagnosis:  Fall [W19.XXXA] Chronic atrial fibrillation [I48.20] Closed fracture of right hip, initial encounter [facility]) [S72.001A] Patient Active Problem List   Diagnosis Date Noted  . Closed intertrochanteric  fracture of hip,  right, initial encounter (HCC) 06/05/2019  . Atrial fibrillation, chronic 06/05/2019  . Essential hypertension 06/05/2019  . CKD (chronic kidney disease), stage III (HCC) 06/05/2019  . AKI (acute kidney injury) (HCC) 06/05/2019   PCP:  Joette CatchingNyland, Leonard, MD Pharmacy:   [facility] [id number] - [place], [place] - [address] [place] [address] Phone: [phone] Fax: [phone]     Social Determinants of Health (SDOH) Interventions    Readmission Risk Interventions No flowsheet data found.

## 2019-06-08 NOTE — Progress Notes (Signed)
PROGRESS NOTE    Gladis RiffleGale B Siciliano  NFA:[id number]:[id number] DOB: 10/02/1941 DOA: 06/05/2019 PCP: Joette CatchingNyland, Leonard, MD   Brief Narrative:  78 year old with history of essential hypertension, atrial fibrillation/paroxysmal not on anticoagulation came after a fall sustaining a hip fracture.  Patient also has lot of general anxiety.  Underwent intramedullary nailing of the hip on 9/3.   Assessment & Plan:   Principal Problem:   Closed intertrochanteric fracture of hip, right, initial encounter [facility](HCC) Active Problems:   Atrial fibrillation, chronic   Essential hypertension   CKD (chronic kidney disease), stage III (HCC)   AKI (acute kidney injury) (HCC)  Right hip fracture status post mechanical fall status post intramedullary nailing of the hip on 9/3. -Tolerated the procedure well.  Pain control, bowel regimen. -PT/OT recommending rehab, case manager aware. -We will add Flexeril for muscle spasms  Ortho recommends-Lovenox 40 mg every day for 30 days for DVT prophylaxis.  Weightbearing as tolerated on the right lower extremity.  Follow-up in 2 weeks.  Dressing to be left intact until follow-up, keep it dry.  Scripts in the chart  Atrial fibrillation with RVR, intermittent - Previously offered Xarelto by PCP but never felt it.  Continue home medication.  Advised him to discuss it again with his PCP if he wants to get back on long-term anticoagulation.  Vitamin D deficiency -Supplement started  Generalized anxiety - On Ativan at home as needed  Acute kidney injury Mild rhabdomyolysis secondary to fall -Admission creatinine 1.75.  This is trended down to 1.07 - CK very minimally elevated.  Continue to monitor  Essential hypertension -On Cardizem  DVT prophylaxis: Lovenox Code Status: Full code Family Communication: None at bedside Disposition Plan: Skilled nursing facility placement Consultants:   Orthopedic  Procedures:   Intramedullary nailing of the right hip 9/3  Antimicrobials:    None   Subjective: Having some muscle spasm of the right lower extremity in the hip otherwise doing well.  Review of Systems Otherwise negative except as per HPI, including: General = no fevers, chills, dizziness, malaise, fatigue HEENT/EYES = negative for pain, redness, loss of vision, double vision, blurred vision, loss of hearing, sore throat, hoarseness, dysphagia Cardiovascular= negative for chest pain, palpitation, murmurs, lower extremity swelling Respiratory/lungs= negative for shortness of breath, cough, hemoptysis, wheezing, mucus production Gastrointestinal= negative for nausea, vomiting,, abdominal pain, melena, hematemesis Genitourinary= negative for Dysuria, Hematuria, Change in Urinary Frequency MSK = Negative for arthralgia, myalgias, Back Pain, Joint swelling  Neurology= Negative for headache, seizures, numbness, tingling  Psychiatry= Negative for anxiety, depression, suicidal and homocidal ideation Allergy/Immunology= Medication/Food allergy as listed  Skin= Negative for Rash, lesions, ulcers, itching    Objective: Vitals:   06/07/19 1220 06/07/19 2010 06/08/19 0412 06/08/19 0818  BP: (!) 125/91 (!) 127/91 (!) 128/92 140/89  Pulse: (!) 136 (!) 112 (!) 112 99  Resp: 16 18 16 16   Temp: 98.5 F (36.9 C) 97.8 F (36.6 C) (!) 97.5 F (36.4 C) 97.9 F (36.6 C)  TempSrc: Oral  Oral Oral  SpO2: 98% 98% 99% 100%  Weight:      Height:        Intake/Output Summary (Last 24 hours) at 06/08/2019 0932 Last data filed at 06/08/2019 0844 Gross per 24 hour  Intake 720 ml  Output 1450 ml  Net -730 ml   Filed Weights   06/05/19 1630 06/06/19 1429  Weight: 72.6 kg 75.6 kg    Examination: Constitutional: Anxious Eyes: PERRL, lids and conjunctivae normal ENMT: Mucous membranes are  moist. Posterior pharynx clear of any exudate or lesions.Normal dentition.  Neck: normal, supple, no masses, no thyromegaly Respiratory: clear to auscultation bilaterally, no wheezing, no  crackles. Normal respiratory effort. No accessory muscle use.  Cardiovascular: Regular rate and rhythm, no murmurs / rubs / gallops. No extremity edema. 2+ pedal pulses. No carotid bruits.  Abdomen: no tenderness, no masses palpated. No hepatosplenomegaly. Bowel sounds positive.  Musculoskeletal: no clubbing / cyanosis. No joint deformity upper and lower extremities. Good ROM, no contractures. Normal muscle tone.  Skin: Right hip dressing without any bleeding Neurologic: CN 2-12 grossly intact. Sensation intact, DTR normal. Strength 5/5 in all 4.  Psychiatric: Normal judgment and insight. Alert and oriented x 3. Normal mood.    Data Reviewed:   CBC: Recent Labs  Lab 06/05/19 1645 06/06/19 1015 06/07/19 0404 06/08/19 0330  WBC 11.7* 6.8 8.9 12.2*  NEUTROABS 10.1*  --   --   --   HGB 15.3 14.8 13.1 11.2*  HCT 45.1 46.3 39.2 32.4*  MCV 94.9 100.7* 94.5 93.1  PLT 197 176 179 163   Basic Metabolic Panel: Recent Labs  Lab 06/05/19 1645 06/06/19 1015 06/07/19 0404 06/08/19 0330  NA 144 134* 136 134*  K 3.9 4.3 4.2 4.2  CL 110 104 102 100  CO2 23 19* 21* 23  GLUCOSE 94 91 136* 94  BUN 16 15 16 21   CREATININE 1.75* 1.10 1.14 1.07  CALCIUM 8.4* 8.5* 8.4* 8.5*  MG  --   --  1.8 1.9   GFR: Estimated Creatinine Clearance: 60.8 mL/min (by C-G formula based on SCr of 1.07 mg/dL). Liver Function Tests: Recent Labs  Lab 06/05/19 1645  AST 23  ALT 15  ALKPHOS 74  BILITOT 1.4*  PROT 6.5  ALBUMIN 3.3*   No results for input(s): LIPASE, AMYLASE in the last 168 hours. No results for input(s): AMMONIA in the last 168 hours. Coagulation Profile: No results for input(s): INR, PROTIME in the last 168 hours. Cardiac Enzymes: Recent Labs  Lab 06/06/19 1015  CKTOTAL 487*   BNP (last 3 results) No results for input(s): PROBNP in the last 8760 hours. HbA1C: No results for input(s): HGBA1C in the last 72 hours. CBG: No results for input(s): GLUCAP in the last 168 hours. Lipid  Profile: No results for input(s): CHOL, HDL, LDLCALC, TRIG, CHOLHDL, LDLDIRECT in the last 72 hours. Thyroid Function Tests: Recent Labs    06/06/19 0808  TSH 1.988   Anemia Panel: No results for input(s): VITAMINB12, FOLATE, FERRITIN, TIBC, IRON, RETICCTPCT in the last 72 hours. Sepsis Labs: No results for input(s): PROCALCITON, LATICACIDVEN in the last 168 hours.  Recent Results (from the past 240 hour(s))  SARS Coronavirus 2 [facility] order, Performed in [facility] hospital lab) Nasopharyngeal Nasopharyngeal Swab     Status: None   Collection Time: 06/05/19  6:27 PM   Specimen: Nasopharyngeal Swab  Result Value Ref Range Status   SARS Coronavirus 2 NEGATIVE NEGATIVE Final    Comment: (NOTE) If result is NEGATIVE SARS-CoV-2 target nucleic acids are NOT DETECTED. The SARS-CoV-2 RNA is generally detectable in upper and lower  respiratory specimens during the acute phase of infection. The lowest  concentration of SARS-CoV-2 viral copies this assay can detect is 250  copies / mL. A negative result does not preclude SARS-CoV-2 infection  and should not be used as the sole basis for treatment or other  patient management decisions.  A negative result may occur with  improper specimen collection / handling,  submission of specimen other  than nasopharyngeal swab, presence of viral mutation(s) within the  areas targeted by this assay, and inadequate number of viral copies  (<250 copies / mL). A negative result must be combined with clinical  observations, patient history, and epidemiological information. If result is POSITIVE SARS-CoV-2 target nucleic acids are DETECTED. The SARS-CoV-2 RNA is generally detectable in upper and lower  respiratory specimens dur ing the acute phase of infection.  Positive  results are indicative of active infection with SARS-CoV-2.  Clinical  correlation with patient history and other diagnostic information is  necessary to determine patient infection  status.  Positive results do  not rule out bacterial infection or co-infection with other viruses. If result is PRESUMPTIVE POSTIVE SARS-CoV-2 nucleic acids MAY BE PRESENT.   A presumptive positive result was obtained on the submitted specimen  and confirmed on repeat testing.  While 2019 novel coronavirus  (SARS-CoV-2) nucleic acids may be present in the submitted sample  additional confirmatory testing may be necessary for epidemiological  and / or clinical management purposes  to differentiate between  SARS-CoV-2 and other Sarbecovirus currently known to infect humans.  If clinically indicated additional testing with an alternate test  methodology [phone]) is advised. The SARS-CoV-2 RNA is generally  detectable in upper and lower respiratory sp ecimens during the acute  phase of infection. The expected result is Negative. Fact Sheet for Patients:  StrictlyIdeas.no Fact Sheet for Healthcare Providers: BankingDealers.co.za This test is not yet approved or cleared by the [place] FDA and has been authorized for detection and/or diagnosis of SARS-CoV-2 by FDA under an Emergency Use Authorization (EUA).  This EUA will remain in effect (meaning this test can be used) for the duration of the COVID-19 declaration under Section 564(b)(1) of the Act, 21 U.S.C. section 360bbb-3(b)(1), unless the authorization is terminated or revoked sooner. Performed at [facility], [place] [address] [place], [place] [address]   Surgical pcr screen     Status: None   Collection Time: 06/06/19 12:28 PM   Specimen: Nasal Mucosa; Nasal Swab  Result Value Ref Range Status   MRSA, PCR NEGATIVE NEGATIVE Final   Staphylococcus aureus NEGATIVE NEGATIVE Final    Comment: (NOTE) The Xpert SA Assay (FDA approved for NASAL specimens in patients 17 years of age and older), is one component of a comprehensive surveillance program. It is not intended to  diagnose infection nor to guide or monitor treatment. Performed at [facility], [place] [address] [place],  [address]          Radiology Studies: Dg C-arm 1-60 Min  Result Date: 06/06/2019 CLINICAL DATA:  Right hip fracture EXAM: RIGHT FEMUR 2 VIEWS; DG C-ARM 1-60 MIN COMPARISON:  06/05/2019 FINDINGS: Four low resolution intraoperative spot views of the right femur. Total fluoroscopy time was 50 seconds. The images demonstrate intramedullary rod fixation of the right femur for comminuted intertrochanteric fracture. Displaced lesser trochanteric fracture fragment is noted. IMPRESSION: Intraoperative fluoroscopic assistance provided during surgical fixation of right intertrochanteric fracture Electronically Signed   By: Donavan Foil M.D.   On: 06/06/2019 16:46   Dg Femur, Min 2 Views Right  Result Date: 06/06/2019 CLINICAL DATA:  Right hip fracture EXAM: RIGHT FEMUR 2 VIEWS; DG C-ARM 1-60 MIN COMPARISON:  06/05/2019 FINDINGS: Four low resolution intraoperative spot views of the right femur. Total fluoroscopy time was 50 seconds. The images demonstrate intramedullary rod fixation of the right femur for comminuted intertrochanteric fracture. Displaced lesser trochanteric fracture  fragment is noted. IMPRESSION: Intraoperative fluoroscopic assistance provided during surgical fixation of right intertrochanteric fracture Electronically Signed   By: Jasmine PangKim  Fujinaga M.D.   On: 06/06/2019 16:46        Scheduled Meds: . aspirin EC  81 mg Oral Daily  . cholecalciferol  1,000 Units Oral Daily  . diltiazem  120 mg Oral Daily  . docusate sodium  100 mg Oral BID  . enoxaparin (LOVENOX) injection  40 mg Subcutaneous Q24H  . escitalopram  10 mg Oral Daily  . feeding supplement (ENSURE ENLIVE)  237 mL Oral BID BM  . simvastatin  10 mg Oral QPM   Continuous Infusions:    LOS: 3 days   Time spent= 15 mins     Joline Maxcyhirag , MD [facility]  If 7PM-7AM, please contact  night-coverage [email or web address] 06/08/2019, 9:32 AM

## 2019-06-08 NOTE — Plan of Care (Signed)

## 2019-06-08 NOTE — Progress Notes (Signed)
SPORTS MEDICINE AND JOINT REPLACEMENT  Lara Mulch, MD    Carlyon Shadow, PA-C [place], [place], [place]  [address]                             [phone]   PROGRESS NOTE  Subjective:  negative for Chest Pain  negative for Shortness of Breath  negative for Nausea/Vomiting   negative for Calf Pain  negative for Bowel Movement   Tolerating Diet: yes         Patient reports pain as 3 on 0-10 scale.    Objective: Vital signs in last 24 hours:    Patient Vitals for the past 24 hrs:  BP Temp Temp src Pulse Resp SpO2  06/08/19 0818 140/89 97.9 F (36.6 C) Oral 99 16 100 %  06/08/19 0412 (!) 128/92 (!) 97.5 F (36.4 C) Oral (!) 112 16 99 %  06/07/19 2010 (!) 127/91 97.8 F (36.6 C) - (!) 112 18 98 %  06/07/19 1220 (!) 125/91 98.5 F (36.9 C) Oral (!) 136 16 98 %    @flow {1959:LAST@   Intake/Output from previous day:   09/04 0701 - 09/05 0700 In: 360 [P.O.:360] Out: 650 [Urine:650]   Intake/Output this shift:   No intake/output data recorded.   Intake/Output      09/04 0701 - 09/05 0700 09/05 0701 - 09/06 0700   P.O. 360    I.V. (mL/kg)     IV Piggyback     Total Intake(mL/kg) 360 (4.8)    Urine (mL/kg/hr) 650 (0.4)    Blood     Total Output 650    Net -290            LABORATORY DATA: Recent Labs    06/05/19 1645 06/06/19 1015 06/07/19 0404 06/08/19 0330  WBC 11.7* 6.8 8.9 12.2*  HGB 15.3 14.8 13.1 11.2*  HCT 45.1 46.3 39.2 32.4*  PLT 197 176 179 163   Recent Labs    06/05/19 1645 06/06/19 1015 06/07/19 0404 06/08/19 0330  NA 144 134* 136 134*  K 3.9 4.3 4.2 4.2  CL 110 104 102 100  CO2 23 19* 21* 23  BUN 16 15 16 21   CREATININE 1.75* 1.10 1.14 1.07  GLUCOSE 94 91 136* 94  CALCIUM 8.4* 8.5* 8.4* 8.5*   No results found for: INR, PROTIME  Examination:  General appearance: alert, cooperative and no distress Extremities: extremities normal, atraumatic, no cyanosis or edema  Wound Exam: clean, dry, intact   Drainage:  None:  wound tissue dry  Motor Exam: Quadriceps and Hamstrings Intact  Sensory Exam: Superficial Peroneal, Deep Peroneal and Tibial normal   Assessment:    2 Days Post-Op  Procedure(s) (LRB): INTRAMEDULLARY (IM) NAIL INTERTROCHANTRIC (Right)  ADDITIONAL DIAGNOSIS:  Principal Problem:   Closed intertrochanteric fracture of hip, right, initial encounter (HCC) Active Problems:   Atrial fibrillation, chronic   Essential hypertension   CKD (chronic kidney disease), stage III (HCC)   AKI (acute kidney injury) ([place])     Plan: Physical Therapy as ordered Weight Bearing as Tolerated (WBAT)  DVT Prophylaxis:  Lovenox  Patient is a high fall risk and would benefit from SNF placement. He is improving from an orthopedic standpoint and would likely be ready fro D/C tomorrow. Medicine following  Donia Ast 06/08/2019, 8:20 AM

## 2019-06-09 LAB — CBC
HCT: 33.8 % — ABNORMAL LOW (ref 39.0–52.0)
Hemoglobin: 11.5 g/dL — ABNORMAL LOW (ref 13.0–17.0)
MCH: 32.4 pg (ref 26.0–34.0)
MCHC: 34 g/dL (ref 30.0–36.0)
MCV: 95.2 fL (ref 80.0–100.0)
Platelets: 190 10*3/uL (ref 150–400)
RBC: 3.55 MIL/uL — ABNORMAL LOW (ref 4.22–5.81)
RDW: 12.8 % (ref 11.5–15.5)
WBC: 8.5 10*3/uL (ref 4.0–10.5)
nRBC: 0 % (ref 0.0–0.2)

## 2019-06-09 LAB — BASIC METABOLIC PANEL
Anion gap: 10 (ref 5–15)
BUN: 21 mg/dL (ref 8–23)
CO2: 27 mmol/L (ref 22–32)
Calcium: 8.3 mg/dL — ABNORMAL LOW (ref 8.9–10.3)
Chloride: 98 mmol/L (ref 98–111)
Creatinine, Ser: 0.96 mg/dL (ref 0.61–1.24)
GFR calc Af Amer: >60 mL/min (ref >60)
GFR calc non Af Amer: >60 mL/min (ref >60)
Glucose, Bld: 88 mg/dL (ref 70–99)
Potassium: 4 mmol/L (ref 3.5–5.1)
Sodium: 135 mmol/L (ref 135–145)

## 2019-06-09 LAB — MAGNESIUM: Magnesium: 2 mg/dL (ref 1.7–2.4)

## 2019-06-09 NOTE — Plan of Care (Signed)

## 2019-06-09 NOTE — Progress Notes (Signed)
Subjective: 3 Days Post-Op Procedure(s) (LRB): INTRAMEDULLARY (IM) NAIL INTERTROCHANTRIC (Right) Patient reports pain as excruciating when he moves     Objective: Vital signs in last 24 hours: Temp:  [98.1 F (36.7 C)-98.5 F (36.9 C)] 98.5 F (36.9 C) (09/06 0902) Pulse Rate:  [86-106] 106 (09/06 0902) Resp:  [16-18] 16 (09/06 0902) BP: (114-155)/(85-94) 155/86 (09/06 0902) SpO2:  [98 %-99 %] 98 % (09/06 0902)  Intake/Output from previous day: 09/05 0701 - 09/06 0700 In: 720 [P.O.:720] Out: 2775 [Urine:2775] Intake/Output this shift: Total I/O In: 600 [P.O.:600] Out: 700 [Urine:700]  Recent Labs    06/07/19 0404 06/08/19 0330 06/09/19 0715  HGB 13.1 11.2* 11.5*   Recent Labs    06/08/19 0330 06/09/19 0715  WBC 12.2* 8.5  RBC 3.48* 3.55*  HCT 32.4* 33.8*  PLT 163 190   Recent Labs    06/08/19 0330 06/09/19 0715  NA 134* 135  K 4.2 4.0  CL 100 98  CO2 23 27  BUN 21 21  CREATININE 1.07 0.96  GLUCOSE 94 88  CALCIUM 8.5* 8.3*   No results for input(s): LABPT, INR in the last 72 hours.  ABD soft Neurovascular intact Sensation intact distally Intact pulses distally Dorsiflexion/Plantar flexion intact Incision: scant drainage   Assessment/Plan: 3 Days Post-Op Procedure(s) (LRB): INTRAMEDULLARY (IM) NAIL INTERTROCHANTRIC (Right) Advance diet Up with therapy  Discussed discharge plan with this patient.  He ideally wants to go home as his wife has dementia and his daughter is bipolar.  He is weight bearing as tolerated on his right leg so as soon as he can tolerate getting up and walking with a walker he can go home with home health.  If he does not make significant mobility gains in the next two days he will need to go to SNF.    Shep Porter J Erick Murin 06/09/2019, 2:51 PM

## 2019-06-09 NOTE — Progress Notes (Signed)
PROGRESS NOTE    Todd Bright  YCX:[id number] DOB: Feb 16, 1941 DOA: 06/05/2019 PCP: Dione Housekeeper, MD   Brief Narrative:  78 year old with history of essential hypertension, atrial fibrillation/paroxysmal not on anticoagulation came after a fall sustaining a hip fracture.  Patient also has lot of general anxiety.  Underwent intramedullary nailing of the hip on 9/3.  Currently awaiting placement   Assessment & Plan:   Principal Problem:   Closed intertrochanteric fracture of hip, right, initial encounter [facility]) Active Problems:   Atrial fibrillation, chronic   Essential hypertension   CKD (chronic kidney disease), stage III (HCC)   AKI (acute kidney injury) ([place])  Right hip fracture status post mechanical fall status post intramedullary nailing of the hip on 9/3. -Tolerated the procedure well.  Pain control, bowel regimen. -PT/OT recommending rehab, case manager aware. -Flexeril for muscle spasms  Ortho recommends-Lovenox 40 mg every day for 30 days for DVT prophylaxis.  Weightbearing as tolerated on the right lower extremity.  Follow-up in 2 weeks.  Dressing to be left intact until follow-up, keep it dry.  Scripts in the chart  Atrial fibrillation with RVR, intermittent - Previously offered Xarelto by PCP but never felt it.  Continue home medication.  Advised him to discuss it again with his PCP if he wants to get back on long-term anticoagulation.  Vitamin D deficiency -Supplement started  Generalized anxiety - On Ativan at home as needed  Acute kidney injury, resolved Mild rhabdomyolysis secondary to fall -Admission creatinine 1.75.  Today it 0.96 - CK very minimally elevated.  Continue to monitor  Essential hypertension -On Cardizem  DVT prophylaxis: Lovenox Code Status: Full code Family Communication: None at bedside Disposition Plan: Skilled nursing facility placement  Consultants:   Orthopedic  Procedures:   Intramedullary nailing of the right hip 9/3   Antimicrobials:   None   Subjective: Feels better except pain in his right hip with movement.  Review of Systems Otherwise negative except as per HPI, including: General = no fevers, chills, dizziness, malaise, fatigue HEENT/EYES = negative for pain, redness, loss of vision, double vision, blurred vision, loss of hearing, sore throat, hoarseness, dysphagia Cardiovascular= negative for chest pain, palpitation, murmurs, lower extremity swelling Respiratory/lungs= negative for shortness of breath, cough, hemoptysis, wheezing, mucus production Gastrointestinal= negative for nausea, vomiting,, abdominal pain, melena, hematemesis Genitourinary= negative for Dysuria, Hematuria, Change in Urinary Frequency MSK = Negative for arthralgia, myalgias, Back Pain, Joint swelling  Neurology= Negative for headache, seizures, numbness, tingling  Psychiatry= Negative for anxiety, depression, suicidal and homocidal ideation Allergy/Immunology= Medication/Food allergy as listed  Skin= Negative for Rash, lesions, ulcers, itching   Objective: Vitals:   06/08/19 0818 06/08/19 1012 06/08/19 2018 06/09/19 0504  BP: 140/89 114/65 114/85 (!) 123/94  Pulse: 99 98 87 86  Resp: 16 18 18 18   Temp: 97.9 F (36.6 C) 97.8 F (36.6 C) 98.1 F (36.7 C) 98.5 F (36.9 C)  TempSrc: Oral Oral Oral Oral  SpO2: 100% 99% 99% 98%  Weight:      Height:        Intake/Output Summary (Last 24 hours) at 06/09/2019 0939 Last data filed at 06/09/2019 0500 Gross per 24 hour  Intake 360 ml  Output 1975 ml  Net -1615 ml   Filed Weights   06/05/19 1630 06/06/19 1429  Weight: 72.6 kg 75.6 kg    Examination: Constitutional: NAD, calm, comfortable Eyes: PERRL, lids and conjunctivae normal ENMT: Mucous membranes are moist. Posterior pharynx clear of any exudate or lesions.Normal dentition.  Neck: normal, supple, no masses, no thyromegaly Respiratory: clear to auscultation bilaterally, no wheezing, no crackles. Normal  respiratory effort. No accessory muscle use.  Cardiovascular: Regular rate and rhythm, no murmurs / rubs / gallops. No extremity edema. 2+ pedal pulses. No carotid bruits.  Abdomen: no tenderness, no masses palpated. No hepatosplenomegaly. Bowel sounds positive.  Musculoskeletal: Limited range of motion of right hip Skin: Right hip dressing noted without any evidence of bleeding Neurologic: CN 2-12 grossly intact. Sensation intact, DTR normal. Strength 5/5 in all 4.  Psychiatric: Normal judgment and insight. Alert and oriented x 3. Normal mood.   Data Reviewed:   CBC: Recent Labs  Lab 06/05/19 1645 06/06/19 1015 06/07/19 0404 06/08/19 0330 06/09/19 0715  WBC 11.7* 6.8 8.9 12.2* 8.5  NEUTROABS 10.1*  --   --   --   --   HGB 15.3 14.8 13.1 11.2* 11.5*  HCT 45.1 46.3 39.2 32.4* 33.8*  MCV 94.9 100.7* 94.5 93.1 95.2  PLT 197 176 179 163 190   Basic Metabolic Panel: Recent Labs  Lab 06/05/19 1645 06/06/19 1015 06/07/19 0404 06/08/19 0330 06/09/19 0715  NA 144 134* 136 134* 135  K 3.9 4.3 4.2 4.2 4.0  CL 110 104 102 100 98  CO2 23 19* 21* 23 27  GLUCOSE 94 91 136* 94 88  BUN 16 15 16 21 21   CREATININE 1.75* 1.10 1.14 1.07 0.96  CALCIUM 8.4* 8.5* 8.4* 8.5* 8.3*  MG  --   --  1.8 1.9 2.0   GFR: Estimated Creatinine Clearance: 67.8 mL/min (by C-G formula based on SCr of 0.96 mg/dL). Liver Function Tests: Recent Labs  Lab 06/05/19 1645  AST 23  ALT 15  ALKPHOS 74  BILITOT 1.4*  PROT 6.5  ALBUMIN 3.3*   No results for input(s): LIPASE, AMYLASE in the last 168 hours. No results for input(s): AMMONIA in the last 168 hours. Coagulation Profile: No results for input(s): INR, PROTIME in the last 168 hours. Cardiac Enzymes: Recent Labs  Lab 06/06/19 1015  CKTOTAL 487*   BNP (last 3 results) No results for input(s): PROBNP in the last 8760 hours. HbA1C: No results for input(s): HGBA1C in the last 72 hours. CBG: No results for input(s): GLUCAP in the last 168  hours. Lipid Profile: No results for input(s): CHOL, HDL, LDLCALC, TRIG, CHOLHDL, LDLDIRECT in the last 72 hours. Thyroid Function Tests: No results for input(s): TSH, T4TOTAL, FREET4, T3FREE, THYROIDAB in the last 72 hours. Anemia Panel: No results for input(s): VITAMINB12, FOLATE, FERRITIN, TIBC, IRON, RETICCTPCT in the last 72 hours. Sepsis Labs: No results for input(s): PROCALCITON, LATICACIDVEN in the last 168 hours.  Recent Results (from the past 240 hour(s))  SARS Coronavirus 2 [facility] order, Performed in [facility] hospital lab) Nasopharyngeal Nasopharyngeal Swab     Status: None   Collection Time: 06/05/19  6:27 PM   Specimen: Nasopharyngeal Swab  Result Value Ref Range Status   SARS Coronavirus 2 NEGATIVE NEGATIVE Final    Comment: (NOTE) If result is NEGATIVE SARS-CoV-2 target nucleic acids are NOT DETECTED. The SARS-CoV-2 RNA is generally detectable in upper and lower  respiratory specimens during the acute phase of infection. The lowest  concentration of SARS-CoV-2 viral copies this assay can detect is 250  copies / mL. A negative result does not preclude SARS-CoV-2 infection  and should not be used as the sole basis for treatment or other  patient management decisions.  A negative result may occur with  improper specimen collection /  handling, submission of specimen other  than nasopharyngeal swab, presence of viral mutation(s) within the  areas targeted by this assay, and inadequate number of viral copies  (<250 copies / mL). A negative result must be combined with clinical  observations, patient history, and epidemiological information. If result is POSITIVE SARS-CoV-2 target nucleic acids are DETECTED. The SARS-CoV-2 RNA is generally detectable in upper and lower  respiratory specimens dur ing the acute phase of infection.  Positive  results are indicative of active infection with SARS-CoV-2.  Clinical  correlation with patient history and other diagnostic  information is  necessary to determine patient infection status.  Positive results do  not rule out bacterial infection or co-infection with other viruses. If result is PRESUMPTIVE POSTIVE SARS-CoV-2 nucleic acids MAY BE PRESENT.   A presumptive positive result was obtained on the submitted specimen  and confirmed on repeat testing.  While 2019 novel coronavirus  (SARS-CoV-2) nucleic acids may be present in the submitted sample  additional confirmatory testing may be necessary for epidemiological  and / or clinical management purposes  to differentiate between  SARS-CoV-2 and other Sarbecovirus currently known to infect humans.  If clinically indicated additional testing with an alternate test  methodology [phone]([id number]) is advised. The SARS-CoV-2 RNA is generally  detectable in upper and lower respiratory sp ecimens during the acute  phase of infection. The expected result is Negative. Fact Sheet for Patients:  BoilerBrush.com.cyhttps://www.fda.gov/media/136312/download Fact Sheet for Healthcare Providers: https://pope.com/https://www.fda.gov/media/136313/download This test is not yet approved or cleared by the [place] FDA and has been authorized for detection and/or diagnosis of SARS-CoV-2 by FDA under an Emergency Use Authorization (EUA).  This EUA will remain in effect (meaning this test can be used) for the duration of the COVID-19 declaration under Section 564(b)(1) of the Act, 21 U.S.C. section 360bbb-3(b)(1), unless the authorization is terminated or revoked sooner. Performed at [facility], [address] [place], [place] [address]   Surgical pcr screen     Status: None   Collection Time: 06/06/19 12:28 PM   Specimen: Nasal Mucosa; Nasal Swab  Result Value Ref Range Status   MRSA, PCR NEGATIVE NEGATIVE Final   Staphylococcus aureus NEGATIVE NEGATIVE Final    Comment: (NOTE) The Xpert SA Assay (FDA approved for NASAL specimens in patients 79 years of age and older), is one component of a  comprehensive surveillance program. It is not intended to diagnose infection nor to guide or monitor treatment. Performed at [facility], [address] [place], [place] [address]          Radiology Studies: No results found.      Scheduled Meds: . aspirin EC  81 mg Oral Daily  . cholecalciferol  1,000 Units Oral Daily  . diltiazem  120 mg Oral Daily  . docusate sodium  100 mg Oral BID  . enoxaparin (LOVENOX) injection  40 mg Subcutaneous Q24H  . escitalopram  10 mg Oral Daily  . feeding supplement (ENSURE ENLIVE)  237 mL Oral BID BM  . simvastatin  10 mg Oral QPM   Continuous Infusions:    LOS: 4 days   Time spent= 15 mins    Ankit Joline Maxcyhirag Amin, MD [facility]  If 7PM-7AM, please contact night-coverage [email or web address] 06/09/2019, 9:39 AM

## 2019-06-10 LAB — BASIC METABOLIC PANEL
Anion gap: 10 (ref 5–15)
BUN: 21 mg/dL (ref 8–23)
CO2: 28 mmol/L (ref 22–32)
Calcium: 8.4 mg/dL — ABNORMAL LOW (ref 8.9–10.3)
Chloride: 95 mmol/L — ABNORMAL LOW (ref 98–111)
Creatinine, Ser: 1.02 mg/dL (ref 0.61–1.24)
GFR calc Af Amer: >60 mL/min (ref >60)
GFR calc non Af Amer: >60 mL/min (ref >60)
Glucose, Bld: 102 mg/dL — ABNORMAL HIGH (ref 70–99)
Potassium: 4.1 mmol/L (ref 3.5–5.1)
Sodium: 133 mmol/L — ABNORMAL LOW (ref 135–145)

## 2019-06-10 LAB — CBC
HCT: 34.5 % — ABNORMAL LOW (ref 39.0–52.0)
Hemoglobin: 11.9 g/dL — ABNORMAL LOW (ref 13.0–17.0)
MCH: 32.2 pg (ref 26.0–34.0)
MCHC: 34.5 g/dL (ref 30.0–36.0)
MCV: 93.5 fL (ref 80.0–100.0)
Platelets: 220 10*3/uL (ref 150–400)
RBC: 3.69 MIL/uL — ABNORMAL LOW (ref 4.22–5.81)
RDW: 12.6 % (ref 11.5–15.5)
WBC: 8.6 10*3/uL (ref 4.0–10.5)
nRBC: 0 % (ref 0.0–0.2)

## 2019-06-10 LAB — MAGNESIUM: Magnesium: 2 mg/dL (ref 1.7–2.4)

## 2019-06-10 NOTE — Progress Notes (Signed)
PROGRESS NOTE    Todd Bright  ZOX:[id number] DOB: [birth year] DOA: 06/05/2019 PCP: Joette CatchingNyland, Leonard, MD   Brief Narrative:  78 year old with history of essential hypertension, atrial fibrillation/paroxysmal not on anticoagulation came after a fall sustaining a hip fracture.  Patient also has lot of general anxiety.  Underwent intramedullary nailing of the hip on 9/3.  Pending placement   Assessment & Plan:   Principal Problem:   Closed intertrochanteric fracture of hip, right, initial encounter [facility](HCC) Active Problems:   Atrial fibrillation, chronic   Essential hypertension   CKD (chronic kidney disease), stage III (HCC)   AKI (acute kidney injury) (HCC)  Right hip fracture status post mechanical fall status post intramedullary nailing of the hip on 9/3. -Tolerated the procedure well.  Pain control, bowel regimen. -PT/OT recommending rehab, case manager aware. -Flexeril for muscle spasms COVID test for SNF placement-pending  Ortho recommends-Lovenox 40 mg every day for 30 days for DVT prophylaxis.  Weightbearing as tolerated on the right lower extremity.  Follow-up in 2 weeks.  Dressing to be left intact until follow-up, keep it dry.  Scripts in the chart  Atrial fibrillation with RVR, intermittent-resolved - Previously offered Xarelto by PCP but never felt it.  Continue home medication.  Advised him to discuss it again with his PCP if he wants to get back on long-term anticoagulation.  Vitamin D deficiency -Supplement started  Generalized anxiety - On Ativan at home as needed  Acute kidney injury, resolved Mild rhabdomyolysis secondary to fall -Admission creatinine 1.75.  Today it 0.96 - CK very minimally elevated.  Continue to monitor  Essential hypertension -On Cardizem  DVT prophylaxis: Lovenox Code Status: Full code Family Communication: None at bedside Disposition Plan: Pending placement at skilled nursing facility  Consultants:   Orthopedic  Procedures:    Intramedullary nailing of the right hip 9/3  Antimicrobials:   None   Subjective: No complaints    Objective: Vitals:   06/09/19 2105 06/10/19 0517 06/10/19 0809 06/10/19 0810  BP: (!) 140/92 (!) 137/100 (!) 137/100 (!) 149/101  Pulse: 82 87 96 85  Resp: 16 16 14    Temp: 98.9 F (37.2 C) 98.5 F (36.9 C) 99.5 F (37.5 C)   TempSrc: Oral Oral Oral   SpO2: (!) 89% 98%    Weight:      Height:        Intake/Output Summary (Last 24 hours) at 06/10/2019 1120 Last data filed at 06/10/2019 1011 Gross per 24 hour  Intake 360 ml  Output 1650 ml  Net -1290 ml   Filed Weights   06/05/19 1630 06/06/19 1429  Weight: 72.6 kg 75.6 kg    Examination: Constitutional: NAD, calm, comfortable Eyes: PERRL, lids and conjunctivae normal ENMT: Mucous membranes are moist. Posterior pharynx clear of any exudate or lesions.Normal dentition.  Neck: normal, supple, no masses, no thyromegaly Respiratory: clear to auscultation bilaterally, no wheezing, no crackles. Normal respiratory effort. No accessory muscle use.  Cardiovascular: Regular rate and rhythm, no murmurs / rubs / gallops. No extremity edema. 2+ pedal pulses. No carotid bruits.  Abdomen: no tenderness, no masses palpated. No hepatosplenomegaly. Bowel sounds positive.  Musculoskeletal: no clubbing / cyanosis. No joint deformity upper and lower extremities. Good ROM, no contractures. Normal muscle tone.  Skin: Right hip dressing noted at the surgical site Neurologic: CN 2-12 grossly intact. Sensation intact, DTR normal. Strength 4/5 in all 4.  Psychiatric: Normal judgment and insight. Alert and oriented x 3. Normal mood.    Data Reviewed:  CBC: Recent Labs  Lab 06/05/19 1645 06/06/19 1015 06/07/19 0404 06/08/19 0330 06/09/19 0715 06/10/19 0416  WBC 11.7* 6.8 8.9 12.2* 8.5 8.6  NEUTROABS 10.1*  --   --   --   --   --   HGB 15.3 14.8 13.1 11.2* 11.5* 11.9*  HCT 45.1 46.3 39.2 32.4* 33.8* 34.5*  MCV 94.9 100.7* 94.5 93.1  95.2 93.5  PLT 197 176 179 163 190 308   Basic Metabolic Panel: Recent Labs  Lab 06/06/19 1015 06/07/19 0404 06/08/19 0330 06/09/19 0715 06/10/19 0416  NA 134* 136 134* 135 133*  K 4.3 4.2 4.2 4.0 4.1  CL 104 102 100 98 95*  CO2 19* 21* 23 27 28   GLUCOSE 91 136* 94 88 102*  BUN 15 16 21 21 21   CREATININE 1.10 1.14 1.07 0.96 1.02  CALCIUM 8.5* 8.4* 8.5* 8.3* 8.4*  MG  --  1.8 1.9 2.0 2.0   GFR: Estimated Creatinine Clearance: 63.8 mL/min (by C-G formula based on SCr of 1.02 mg/dL). Liver Function Tests: Recent Labs  Lab 06/05/19 1645  AST 23  ALT 15  ALKPHOS 74  BILITOT 1.4*  PROT 6.5  ALBUMIN 3.3*   No results for input(s): LIPASE, AMYLASE in the last 168 hours. No results for input(s): AMMONIA in the last 168 hours. Coagulation Profile: No results for input(s): INR, PROTIME in the last 168 hours. Cardiac Enzymes: Recent Labs  Lab 06/06/19 1015  CKTOTAL 487*   BNP (last 3 results) No results for input(s): PROBNP in the last 8760 hours. HbA1C: No results for input(s): HGBA1C in the last 72 hours. CBG: No results for input(s): GLUCAP in the last 168 hours. Lipid Profile: No results for input(s): CHOL, HDL, LDLCALC, TRIG, CHOLHDL, LDLDIRECT in the last 72 hours. Thyroid Function Tests: No results for input(s): TSH, T4TOTAL, FREET4, T3FREE, THYROIDAB in the last 72 hours. Anemia Panel: No results for input(s): VITAMINB12, FOLATE, FERRITIN, TIBC, IRON, RETICCTPCT in the last 72 hours. Sepsis Labs: No results for input(s): PROCALCITON, LATICACIDVEN in the last 168 hours.  Recent Results (from the past 240 hour(s))  SARS Coronavirus 2 [facility] order, Performed in [facility] hospital lab) Nasopharyngeal Nasopharyngeal Swab     Status: None   Collection Time: 06/05/19  6:27 PM   Specimen: Nasopharyngeal Swab  Result Value Ref Range Status   SARS Coronavirus 2 NEGATIVE NEGATIVE Final    Comment: (NOTE) If result is NEGATIVE SARS-CoV-2 target nucleic acids  are NOT DETECTED. The SARS-CoV-2 RNA is generally detectable in upper and lower  respiratory specimens during the acute phase of infection. The lowest  concentration of SARS-CoV-2 viral copies this assay can detect is 250  copies / mL. A negative result does not preclude SARS-CoV-2 infection  and should not be used as the sole basis for treatment or other  patient management decisions.  A negative result may occur with  improper specimen collection / handling, submission of specimen other  than nasopharyngeal swab, presence of viral mutation(s) within the  areas targeted by this assay, and inadequate number of viral copies  (<250 copies / mL). A negative result must be combined with clinical  observations, patient history, and epidemiological information. If result is POSITIVE SARS-CoV-2 target nucleic acids are DETECTED. The SARS-CoV-2 RNA is generally detectable in upper and lower  respiratory specimens dur ing the acute phase of infection.  Positive  results are indicative of active infection with SARS-CoV-2.  Clinical  correlation with patient history and other diagnostic information  is  necessary to determine patient infection status.  Positive results do  not rule out bacterial infection or co-infection with other viruses. If result is PRESUMPTIVE POSTIVE SARS-CoV-2 nucleic acids MAY BE PRESENT.   A presumptive positive result was obtained on the submitted specimen  and confirmed on repeat testing.  While 2019 novel coronavirus  (SARS-CoV-2) nucleic acids may be present in the submitted sample  additional confirmatory testing may be necessary for epidemiological  and / or clinical management purposes  to differentiate between  SARS-CoV-2 and other Sarbecovirus currently known to infect humans.  If clinically indicated additional testing with an alternate test  methodology [phone]) is advised. The SARS-CoV-2 RNA is generally  detectable in upper and lower respiratory sp ecimens  during the acute  phase of infection. The expected result is Negative. Fact Sheet for Patients:  BoilerBrush.com.cy Fact Sheet for Healthcare Providers: https://pope.com/ This test is not yet approved or cleared by the [place] FDA and has been authorized for detection and/or diagnosis of SARS-CoV-2 by FDA under an Emergency Use Authorization (EUA).  This EUA will remain in effect (meaning this test can be used) for the duration of the COVID-19 declaration under Section 564(b)(1) of the Act, 21 U.S.C. section 360bbb-3(b)(1), unless the authorization is terminated or revoked sooner. Performed at [facility], [address], [place], [place] [address]   Surgical pcr screen     Status: None   Collection Time: 06/06/19 12:28 PM   Specimen: Nasal Mucosa; Nasal Swab  Result Value Ref Range Status   MRSA, PCR NEGATIVE NEGATIVE Final   Staphylococcus aureus NEGATIVE NEGATIVE Final    Comment: (NOTE) The Xpert SA Assay (FDA approved for NASAL specimens in patients 58 years of age and older), is one component of a comprehensive surveillance program. It is not intended to diagnose infection nor to guide or monitor treatment. Performed at [facility], [address] [place], [place] [address]          Radiology Studies: No results found.      Scheduled Meds:  aspirin EC  81 mg Oral Daily   cholecalciferol  1,000 Units Oral Daily   diltiazem  120 mg Oral Daily   docusate sodium  100 mg Oral BID   enoxaparin (LOVENOX) injection  40 mg Subcutaneous Q24H   escitalopram  10 mg Oral Daily   feeding supplement (ENSURE ENLIVE)  237 mL Oral BID BM   simvastatin  10 mg Oral QPM   Continuous Infusions:    LOS: 5 days   Time spent= 15 mins    Anastazja Isaac Joline Maxcy, MD [facility]  If 7PM-7AM, please contact night-coverage [email or web address] 06/10/2019, 11:20 AM

## 2019-06-10 NOTE — TOC Progression Note (Signed)
Transition of Care [facility]) - Progression Note    Patient Details  Name: RAKAN SOFFER MRN: [id number] Date of Birth: 1941-08-14  Transition of Care [facility]) CM/SW Contact  Bartholomew Crews, RN Phone Number: [phone] 06/10/2019, 11:39 AM  Clinical Narrative:    Spoke with patient at the bedside. Discussed bed offers. Patient is agreeable to [facility]. Spoke with Jackelyn Poling at [facility]. They will not have a bed available until late afternoon tomorrow, but they can accept him. Patient will need new covid test - noted MD order. Spoke with bedside RN about needed test.    Expected Discharge Plan: [place] Barriers to Discharge: Continued Medical Work up  Expected Discharge Plan and Services Expected Discharge Plan: [place] In-house Referral: Clinical Social Work Discharge Planning Services: CM Consult Post Acute Care Choice: [place] Living arrangements for the past 2 months: [place] Expected Discharge Date: 06/09/19                                     Social Determinants of Health (SDOH) Interventions    Readmission Risk Interventions No flowsheet data found.

## 2019-06-10 NOTE — Progress Notes (Signed)
Physical Therapy Treatment Patient Details Name: Todd Bright MRN: [id number] DOB: October 30, 1940 Today's Date: 06/10/2019    History of Present Illness 78 y.o male who presented from home after falling in the garden with R hip fx s/p intramedullary nail intertrochanteric. PMH includes arrythmia, chronic A-fib, chronic kidney disease and hypertension.    PT Comments    Pt progressing with functional mobility, able to tolerate increased activity this session including standing with RW and gait training. Pt requires min assist for bed mobility and sit<>stand with RW, mod assist for gait 2-3 steps with cues for weightbearing precautions and techniques, pt continues to be limited by pain during gait as he is unable to tolerate much weight on his affected LE. Pt will continue to benefit from follow up therapy at SNF in order to maximize independence with functional mobility.     Follow Up Recommendations  SNF;Supervision/Assistance - 24 hour     Equipment Recommendations  Other (comment)(tbd next venue)    Recommendations for Other Services       Precautions / Restrictions Precautions Precautions: Fall Precaution Comments: monitor HR (pt gets anxious and HR goes up) was in 140's sitting EOB and went up to 180's with side step up to [facility], returned to 140's when back supine in bed Restrictions Weight Bearing Restrictions: Yes RLE Weight Bearing: Weight bearing as tolerated    Mobility  Bed Mobility Overal bed mobility: Needs Assistance Bed Mobility: Supine to Sit;Sit to Supine     Supine to sit: Min assist Sit to supine: Min assist   General bed mobility comments: min assist for R LE management, pt with heavy reliance on bedrails to pull to sit  Transfers Overall transfer level: Needs assistance Equipment used: Rolling walker (2 wheeled) Transfers: Sit to/from Stand Sit to Stand: Mod assist;Min assist         General transfer comment: pt performed sit<>stand x 2 this session  from elevated bed with min assist on first attempt and mod assist for second, using RW, cues for techniques, pt very anxious with mobility  Ambulation/Gait Ambulation/Gait assistance: Mod assist Gait Distance (Feet): 3 Feet Assistive device: Rolling walker (2 wheeled) Gait Pattern/deviations: Decreased stance time - right;Decreased step length - left;Decreased weight shift to right;Trunk flexed Gait velocity: decreased Gait velocity interpretation: <1.31 ft/sec, indicative of household ambulator General Gait Details: pt performed x 3 steps forward from EOB and then x 3 steps back to the bed using RW for UE support and mod assist, cues for techniques and increased support through UEs to off weight R LE during L stepping    Modified Rankin (Stroke Patients Only)       Balance Overall balance assessment: Needs assistance Sitting-balance support: Feet supported;No upper extremity supported Sitting balance-Leahy Scale: Good Sitting balance - Comments: static sitting balance with supervision, supervision during dynamic sitting balance with lateral weightshifting and scooting hips to EOB   Standing balance support: Bilateral upper extremity supported;During functional activity Standing balance-Leahy Scale: Poor Standing balance comment: min assist for static standing balance with RW,  mod assist for dynamic standing balance during ambulation, heavy reliance on UE support with RW            Cognition Arousal/Alertness: Awake/alert Behavior During Therapy: Anxious Overall Cognitive Status: Within Functional Limits for tasks assessed  Exercises General Exercises - Lower Extremity Ankle Circles/Pumps: AROM;20 reps;Both Long Arc Quad: AROM;Right;10 reps Hip ABduction/ADduction: AROM;Right;10 reps    General Comments        Pertinent Vitals/Pain Pain Assessment: Faces Faces Pain Scale: Hurts even more Pain Location: R  hip Pain Descriptors / Indicators: Aching;Guarding;Grimacing    Home Living                      Prior Function            PT Goals (current goals can now be found in the care plan section) Progress towards PT goals: Progressing toward goals    Frequency    Min 3X/week      PT Plan Current plan remains appropriate    Co-evaluation              AM-PAC PT "6 Clicks" Mobility   Outcome Measure  Help needed turning from your back to your side while in a flat bed without using bedrails?: A Little Help needed moving from lying on your back to sitting on the side of a flat bed without using bedrails?: A Little Help needed moving to and from a bed to a chair (including a wheelchair)?: A Lot Help needed standing up from a chair using your arms (e.g., wheelchair or bedside chair)?: A Little Help needed to walk in hospital room?: A Lot Help needed climbing 3-5 steps with a railing? : Total 6 Click Score: 14    End of Session Equipment Utilized During Treatment: Gait belt Activity Tolerance: Patient tolerated treatment well Patient left: in chair;with chair alarm set;with call bell/phone within reach Nurse Communication: Mobility status PT Visit Diagnosis: Unsteadiness on feet (R26.81);Pain;Muscle weakness (generalized) (M62.81) Pain - Right/Left: Right Pain - part of body: Hip     Time: [id number] PT Time Calculation (min) (ACUTE ONLY): 26 min  Charges:  $Therapeutic Exercise: 8-22 mins $Therapeutic Activity: 8-22 mins                     Cresenciano GenreEmily van Schagen, PT, DPT Acute Rehab Office [phone]     Mindi Curlingmily van Schagen 06/10/2019, 9:46 AM

## 2019-06-10 NOTE — Progress Notes (Signed)
Subjective: 4 Days Post-Op Procedure(s) (LRB): INTRAMEDULLARY (IM) NAIL INTERTROCHANTRIC (Right) Patient reports pain as excruciating with certain movement but otherwise very manageable.  Objective: Vital signs in last 24 hours: Temp:  [98.1 F (36.7 C)-98.9 F (37.2 C)] 98.5 F (36.9 C) (09/07 0517) Pulse Rate:  [82-106] 87 (09/07 0517) Resp:  [16-18] 16 (09/07 0517) BP: (125-155)/(84-100) 137/100 (09/07 0517) SpO2:  [89 %-98 %] 98 % (09/07 0517)  Intake/Output from previous day: 09/06 0701 - 09/07 0700 In: 600 [P.O.:600] Out: 2350 [Urine:2350] Intake/Output this shift: Total I/O In: -  Out: 750 [Urine:750]  Recent Labs    06/08/19 0330 06/09/19 0715 06/10/19 0416  HGB 11.2* 11.5* 11.9*   Recent Labs    06/09/19 0715 06/10/19 0416  WBC 8.5 8.6  RBC 3.55* 3.69*  HCT 33.8* 34.5*  PLT 190 220   Recent Labs    06/09/19 0715 06/10/19 0416  NA 135 133*  K 4.0 4.1  CL 98 95*  CO2 27 28  BUN 21 21  CREATININE 0.96 1.02  GLUCOSE 88 102*  CALCIUM 8.3* 8.4*   No results for input(s): LABPT, INR in the last 72 hours.  ABD soft Neurovascular intact Sensation intact distally Incision: scant drainage   Assessment/Plan: 4 Days Post-Op Procedure(s) (LRB): INTRAMEDULLARY (IM) NAIL INTERTROCHANTRIC (Right) Advance diet Up with therapy  OK for SNF when bed available.  Dressing change before discharge   [place] 06/10/2019, 7:00 AM

## 2019-06-10 NOTE — Care Management Important Message (Signed)
Important Message  Patient Details  Name: Todd Bright MRN: [id number] Date of Birth: [birth year]   Medicare Important Message Given:  Yes     Memory Argue 06/10/2019, 3:11 PM

## 2019-06-11 LAB — NOVEL CORONAVIRUS, NAA (HOSP ORDER, SEND-OUT TO REF LAB; TAT 18-24 HRS): SARS-CoV-2, NAA: NOT DETECTED

## 2019-06-11 NOTE — Progress Notes (Signed)
Physical Therapy Treatment Patient Details Name: Todd Bright Lacasse MRN: [id number] DOB: 1941-01-09 Today's Date: 06/11/2019    History of Present Illness 78 y.o male who presented from home after falling in the garden with R hip fx s/p intramedullary nail intertrochanteric. PMH includes arrythmia, chronic A-fib, chronic kidney disease and hypertension.    PT Comments    Pt performed transfer from recliner back to bed while utilizing sara stedy sit to stand lift.   Pt required min to moderate assistance and +2 for safety.  Continue to agree with SNF placement based on function.  Plan for progression of gt training next session to tolerance.     Follow Up Recommendations  SNF;Supervision/Assistance - 24 hour     Equipment Recommendations  Other (comment)(TBD)    Recommendations for Other Services       Precautions / Restrictions Precautions Precautions: Fall Precaution Comments: monitor HR (pt gets anxious and HR goes up) was in 140's sitting EOB and went up to 180's [facility], returned to 140's when back supine in bed Restrictions Weight Bearing Restrictions: Yes RLE Weight Bearing: Weight bearing as tolerated    Mobility  Bed Mobility Overal bed mobility: Needs Assistance Bed Mobility: Sit to Supine     Supine to sit: +2 for physical assistance;Min assist Sit to supine: Min assist   General bed mobility comments: Pt required assistance for bilateral LE to position back to bed.  Transfers Overall transfer level: Needs assistance Equipment used: Ambulation equipment used(sara stedy) Transfers: Sit to/from Stand Sit to Stand: Mod assist;+2 safety/equipment Stand pivot transfers: +2 physical assistance;Min assist       General transfer comment: Pt performed sit to stand from recliner to sara stedy with mod +1/+2 for safety.  Pt required boost into standing with cues for hand and foot placement.  From higher seat of stedy frame he was able to stand with min  guard assistance.  Ambulation/Gait Ambulation/Gait assistance: (NT) Gait Distance (Feet): 5 Feet Assistive device: Rolling walker (2 wheeled) Gait Pattern/deviations: Decreased stance time - right;Decreased weight shift to right Gait velocity: decreased   General Gait Details: ambulated 4 feet forward then sat and rested, [facility] Then ambulated 3 feet forward to get back into recliner. Impulsive with mobility due to anxiety/fear.   Stairs             Wheelchair Mobility    Modified Rankin (Stroke Patients Only)       Balance Overall balance assessment: Needs assistance Sitting-balance support: Feet supported;Single extremity supported Sitting balance-Leahy Scale: Poor(dynamic) Sitting balance - Comments: static sitting balance with supervision   Standing balance support: Bilateral upper extremity supported Standing balance-Leahy Scale: Poor Standing balance comment: reliant on BUE                            Cognition Arousal/Alertness: Awake/alert Behavior During Therapy: Anxious Overall Cognitive Status: Difficult to assess Area of Impairment: Safety/judgement                         Safety/Judgement: Decreased awareness of safety     General Comments: When he understand cueing he appears with in functional limits.      Exercises     General Comments        Pertinent Vitals/Pain Pain Assessment: Faces Faces Pain Scale: Hurts even more Pain Location: R hip with activity, reports no pain when  at rest Pain Descriptors / Indicators: Grimacing;Guarding;Discomfort;Sore;Moaning Pain Intervention(s): Monitored during session;Repositioned    Home Living                      Prior Function            PT Goals (current goals can now be found in the care plan section) Acute Rehab PT Goals Patient Stated Goal: to go home and be able to take care of my wife PT Goal Formulation: With patient Time For  Goal Achievement: 06/21/19 Potential to Achieve Goals: Fair Progress towards PT goals: Progressing toward goals    Frequency    Min 3X/week      PT Plan Current plan remains appropriate    Co-evaluation PT/OT/SLP Co-Evaluation/Treatment: Yes Reason for Co-Treatment: For patient/therapist safety;To address functional/ADL transfers;Complexity of the patient's impairments (multi-system involvement) PT goals addressed during session: Mobility/safety with mobility;Balance;Proper use of DME OT goals addressed during session: ADL's and self-care      AM-PAC PT "6 Clicks" Mobility   Outcome Measure  Help needed turning from your back to your side while in a flat bed without using bedrails?: A Little Help needed moving from lying on your back to sitting on the side of a flat bed without using bedrails?: A Little Help needed moving to and from a bed to a chair (including a wheelchair)?: A Lot Help needed standing up from a chair using your arms (e.g., wheelchair or bedside chair)?: A Lot Help needed to walk in hospital room?: A Lot Help needed climbing 3-5 steps with a railing? : Total 6 Click Score: 13    End of Session Equipment Utilized During Treatment: Gait belt;Oxygen Activity Tolerance: Patient tolerated treatment well Patient left: in bed;with bed alarm set;with nursing/sitter in room;with call bell/phone within reach Nurse Communication: Mobility status PT Visit Diagnosis: Unsteadiness on feet (R26.81);Muscle weakness (generalized) (M62.81);Difficulty in walking, not elsewhere classified (R26.2);Pain;Other abnormalities of gait and mobility (R26.89);History of falling (Z91.81) Pain - Right/Left: Right Pain - part of body: Hip     Time: [id number] PT Time Calculation (min) (ACUTE ONLY): 11 min  Charges:  $Therapeutic Activity: 8-22 mins                     Governor Rooks, PTA Acute Rehabilitation Services Pager [phone] Office [phone]     Aretta Stetzel Eli Hose 06/11/2019, 5:45 PM

## 2019-06-11 NOTE — Progress Notes (Signed)
PROGRESS NOTE    Todd Bright  CVE:[id number] DOB: [birth year] DOA: 06/05/2019 PCP: Dione Housekeeper, MD   Brief Narrative:  78 year old with history of essential hypertension, atrial fibrillation/paroxysmal not on anticoagulation came after a fall sustaining a hip fracture.  Patient also has lot of general anxiety.  Underwent intramedullary nailing of the hip on 9/3.  Currently pending placement   Assessment & Plan:   Principal Problem:   Closed intertrochanteric fracture of hip, right, initial encounter [facility]) Active Problems:   Atrial fibrillation, chronic   Essential hypertension   CKD (chronic kidney disease), stage III (HCC)   AKI (acute kidney injury) ([place])  Right hip fracture status post mechanical fall status post intramedullary nailing of the hip on 9/3. -Tolerated the procedure well.  Pain control, bowel regimen. -PT/OT recommending rehab, case manager aware. -Flexeril for muscle spasms COVID takes for SNF placement-pending  Ortho recommends-Lovenox 40 mg every day for 30 days for DVT prophylaxis.  Weightbearing as tolerated on the right lower extremity.  Follow-up in 2 weeks.  Dressing to be left intact until follow-up, keep it dry.  Scripts in the chart  Atrial fibrillation with RVR, intermittent-resolved - Previously offered Xarelto by PCP but never felt it.  Continue home medication.  Advised him to discuss it again with his PCP if he wants to get back on long-term anticoagulation.  Vitamin D deficiency -Supplement started  Generalized anxiety - On Ativan at home as needed  Acute kidney injury, resolved Mild rhabdomyolysis secondary to fall -Admission creatinine 1.75.  - CK very minimally elevated.  Continue to monitor  Essential hypertension -On Cardizem  DVT prophylaxis: Lovenox Code Status: Full code Family Communication: None at bedside Disposition Plan: Pending placement  Consultants:   Orthopedic  Procedures:   Intramedullary nailing of the  right hip 9/3  Antimicrobials:   None   Subjective: No complaints but overall anxious because he has a wife at home with dementia and try to make arrangements for assistance for her at home.    Objective: Vitals:   06/10/19 1647 06/10/19 1922 06/11/19 0400 06/11/19 0748  BP: (!) 131/99 (!) 141/96 (!) 143/90 (!) 131/93  Pulse: (!) 106 95 77 66  Resp: 17 16 16 16   Temp: 97.7 F (36.5 C) 97.8 F (36.6 C) 98.5 F (36.9 C) 98.8 F (37.1 C)  TempSrc: Oral Oral Oral Oral  SpO2: (!) 83% 99% 98% (!) 88%  Weight:      Height:        Intake/Output Summary (Last 24 hours) at 06/11/2019 0953 Last data filed at 06/10/2019 1500 Gross per 24 hour  Intake 240 ml  Output 1000 ml  Net -760 ml   Filed Weights   06/05/19 1630 06/06/19 1429  Weight: 72.6 kg 75.6 kg    Examination: Constitutional: Anxious, comfortable Eyes: PERRL, lids and conjunctivae normal ENMT: Mucous membranes are moist. Posterior pharynx clear of any exudate or lesions.Normal dentition.  Neck: normal, supple, no masses, no thyromegaly Respiratory: clear to auscultation bilaterally, no wheezing, no crackles. Normal respiratory effort. No accessory muscle use.  Cardiovascular: Regular rate and rhythm, no murmurs / rubs / gallops. No extremity edema. 2+ pedal pulses. No carotid bruits.  Abdomen: no tenderness, no masses palpated. No hepatosplenomegaly. Bowel sounds positive.  Musculoskeletal: no clubbing / cyanosis. No joint deformity upper and lower extremities. Good ROM, no contractures. Normal muscle tone.  Skin: Hip dressing noted without any evidence of bleeding Neurologic: CN 2-12 grossly intact. Sensation intact, DTR normal. Strength 4/5  in all 4.  Psychiatric: Normal judgment and insight. Alert and oriented x 3. Normal mood.   Data Reviewed:   CBC: Recent Labs  Lab 06/05/19 1645 06/06/19 1015 06/07/19 0404 06/08/19 0330 06/09/19 0715 06/10/19 0416  WBC 11.7* 6.8 8.9 12.2* 8.5 8.6  NEUTROABS 10.1*  --    --   --   --   --   HGB 15.3 14.8 13.1 11.2* 11.5* 11.9*  HCT 45.1 46.3 39.2 32.4* 33.8* 34.5*  MCV 94.9 100.7* 94.5 93.1 95.2 93.5  PLT 197 176 179 163 190 220   Basic Metabolic Panel: Recent Labs  Lab 06/06/19 1015 06/07/19 0404 06/08/19 0330 06/09/19 0715 06/10/19 0416  NA 134* 136 134* 135 133*  K 4.3 4.2 4.2 4.0 4.1  CL 104 102 100 98 95*  CO2 19* 21* 23 27 28   GLUCOSE 91 136* 94 88 102*  BUN 15 16 21 21 21   CREATININE 1.10 1.14 1.07 0.96 1.02  CALCIUM 8.5* 8.4* 8.5* 8.3* 8.4*  MG  --  1.8 1.9 2.0 2.0   GFR: Estimated Creatinine Clearance: 63.8 mL/min (by C-G formula based on SCr of 1.02 mg/dL). Liver Function Tests: Recent Labs  Lab 06/05/19 1645  AST 23  ALT 15  ALKPHOS 74  BILITOT 1.4*  PROT 6.5  ALBUMIN 3.3*   No results for input(s): LIPASE, AMYLASE in the last 168 hours. No results for input(s): AMMONIA in the last 168 hours. Coagulation Profile: No results for input(s): INR, PROTIME in the last 168 hours. Cardiac Enzymes: Recent Labs  Lab 06/06/19 1015  CKTOTAL 487*   BNP (last 3 results) No results for input(s): PROBNP in the last 8760 hours. HbA1C: No results for input(s): HGBA1C in the last 72 hours. CBG: No results for input(s): GLUCAP in the last 168 hours. Lipid Profile: No results for input(s): CHOL, HDL, LDLCALC, TRIG, CHOLHDL, LDLDIRECT in the last 72 hours. Thyroid Function Tests: No results for input(s): TSH, T4TOTAL, FREET4, T3FREE, THYROIDAB in the last 72 hours. Anemia Panel: No results for input(s): VITAMINB12, FOLATE, FERRITIN, TIBC, IRON, RETICCTPCT in the last 72 hours. Sepsis Labs: No results for input(s): PROCALCITON, LATICACIDVEN in the last 168 hours.  Recent Results (from the past 240 hour(s))  SARS Coronavirus 2 [facility](Hospital order, Performed [facility] lab) Nasopharyngeal Nasopharyngeal Swab     Status: None   Collection Time: 06/05/19  6:27 PM   Specimen: Nasopharyngeal Swab  Result Value Ref Range  Status   SARS Coronavirus 2 NEGATIVE NEGATIVE Final    Comment: (NOTE) If result is NEGATIVE SARS-CoV-2 target nucleic acids are NOT DETECTED. The SARS-CoV-2 RNA is generally detectable in upper and lower  respiratory specimens during the acute phase of infection. The lowest  concentration of SARS-CoV-2 viral copies this assay can detect is 250  copies / mL. A negative result does not preclude SARS-CoV-2 infection  and should not be used as the sole basis for treatment or other  patient management decisions.  A negative result may occur with  improper specimen collection / handling, submission of specimen other  than nasopharyngeal swab, presence of viral mutation(s) within the  areas targeted by this assay, and inadequate number of viral copies  (<250 copies / mL). A negative result must be combined with clinical  observations, patient history, and epidemiological information. If result is POSITIVE SARS-CoV-2 target nucleic acids are DETECTED. The SARS-CoV-2 RNA is generally detectable in upper and lower  respiratory specimens dur ing the acute phase of infection.  Positive  results are indicative of active infection with SARS-CoV-2.  Clinical  correlation with patient history and other diagnostic information is  necessary to determine patient infection status.  Positive results do  not rule out bacterial infection or co-infection with other viruses. If result is PRESUMPTIVE POSTIVE SARS-CoV-2 nucleic acids MAY BE PRESENT.   A presumptive positive result was obtained on the submitted specimen  and confirmed on repeat testing.  While 2019 novel coronavirus  (SARS-CoV-2) nucleic acids may be present in the submitted sample  additional confirmatory testing may be necessary for epidemiological  and / or clinical management purposes  to differentiate between  SARS-CoV-2 and other Sarbecovirus currently known to infect humans.  If clinically indicated additional testing with an alternate  test  methodology [phone]) is advised. The SARS-CoV-2 RNA is generally  detectable in upper and lower respiratory sp ecimens during the acute  phase of infection. The expected result is Negative. Fact Sheet for Patients:  BoilerBrush.com.cy Fact Sheet for Healthcare Providers: https://pope.com/ This test is not yet approved or cleared by the [place] FDA and has been authorized for detection and/or diagnosis of SARS-CoV-2 by FDA under an Emergency Use Authorization (EUA).  This EUA will remain in effect (meaning this test can be used) for the duration of the COVID-19 declaration under Section 564(b)(1) of the Act, 21 U.S.C. section 360bbb-3(b)(1), unless the authorization is terminated or revoked sooner. Performed at [facility], [address] [place], [place] [address]   Surgical pcr screen     Status: None   Collection Time: 06/06/19 12:28 PM   Specimen: Nasal Mucosa; Nasal Swab  Result Value Ref Range Status   MRSA, PCR NEGATIVE NEGATIVE Final   Staphylococcus aureus NEGATIVE NEGATIVE Final    Comment: (NOTE) The Xpert SA Assay (FDA approved for NASAL specimens in patients 70 years of age and older), is one component of a comprehensive surveillance program. It is not intended to diagnose infection nor to guide or monitor treatment. Performed at [facility], [address], [place], [place] [address]   Novel Coronavirus, NAA (hospital order; send-out to ref lab)     Status: None   Collection Time: 06/10/19 10:07 AM   Specimen: Nasopharyngeal Swab; Respiratory  Result Value Ref Range Status   SARS-CoV-2, NAA NOT DETECTED NOT DETECTED Final    Comment: (NOTE) This nucleic acid amplification test was developed and its performance characteristics determined by [employer]. Nucleic acid amplification tests include PCR and TMA. This test has not been FDA cleared or approved. This test has been  authorized by FDA under an Emergency Use Authorization (EUA). This test is only authorized for the duration of time the declaration that circumstances exist justifying the authorization of the emergency use of in vitro diagnostic tests for detection of SARS-CoV-2 virus and/or diagnosis of COVID-19 infection under section 564(b)(1) of the Act, 21 U.S.C. 732KGU-5(K) (1), unless the authorization is terminated or revoked sooner. When diagnostic testing is negative, the possibility of a false negative result should be considered in the context of a patient's recent exposures and the presence of clinical signs and symptoms consistent with COVID-19. An individual without symptoms of COVID- 19 and who is not shedding SARS-CoV-2 vi rus would expect to have a negative (not detected) result in this assay. Performed At: [facility] [address] [place], [place] [id number] Jolene Schimke MD GB:[id number]    Coronavirus Source NASOPHARYNGEAL  Final    Comment: Performed at [facility]  Lab, [address] [place], [place] [address]         Radiology Studies: No results found.      Scheduled Meds: . aspirin EC  81 mg Oral Daily  . cholecalciferol  1,000 Units Oral Daily  . diltiazem  120 mg Oral Daily  . docusate sodium  100 mg Oral BID  . enoxaparin (LOVENOX) injection  40 mg Subcutaneous Q24H  . escitalopram  10 mg Oral Daily  . feeding supplement (ENSURE ENLIVE)  237 mL Oral BID BM  . simvastatin  10 mg Oral QPM   Continuous Infusions:    LOS: 6 days   Time spent= 15 mins    Anvi Mangal Joline Maxcyhirag Saran Laviolette, MD [facility]  If 7PM-7AM, please contact night-coverage [email or web address] 06/11/2019, 9:53 AM

## 2019-06-11 NOTE — Progress Notes (Signed)
Patient has been picked up by [facility]. All belongings sent & Wife Mrs. Fosco was contacted to update on patient leaving.

## 2019-06-11 NOTE — TOC Transition Note (Signed)
Transition of Care [facility]) - CM/SW Discharge Note   Patient Details  Name: Todd Bright MRN: [id number] Date of Birth: [birth year]  Transition of Care [facility]) CM/SW Contact:  Midge Minium RN, BSN, NCM-BC, ACM-RN [phone] Phone Number: 06/11/2019, 3:13 PM   Clinical Narrative:    Patient medically stable to transition to [facility]. CM spoke to [facility] Admission Coordinator) to discuss the POC. Per Jackelyn Poling, they will have a bed available today after 1400. CM met with the patient to inform of transfer, with the patient still agreeable. [facility] arranged for 1530 today; CM informed patients care nurse of the SNF information/contact number for report. No further needs from CM.    Final next level of care: Skilled Nursing Facility Barriers to Discharge: No Barriers Identified   Patient Goals and CMS Choice Patient states their goals for this hospitalization and ongoing recovery are:: to get some rehab and some help for his wife CMS Medicare.gov Compare Post Acute Care list provided to:: Patient Choice offered to / list presented to : Patient  Discharge Placement              Patient chooses bed at: ([place]) Patient to be transferred to facility by: [facility] at 1500 (pending COVID results)   Patient and family notified of of transfer: 06/11/19  Discharge Plan and Services In-house Referral: Clinical Social Work Discharge Planning Services: CM Consult Post Acute Care Choice: [place]            Social Determinants of Health (SDOH) Interventions     Readmission Risk Interventions No flowsheet data found.

## 2019-06-11 NOTE — Progress Notes (Signed)
Physical Therapy Treatment Patient Details Name: Todd Bright MRN: [id number] DOB: 06/09/1941 Today's Date: 06/11/2019    History of Present Illness 78 y.o male who presented from home after falling in the garden with R hip fx s/p intramedullary nail intertrochanteric. PMH includes arrythmia, chronic A-fib, chronic kidney disease and hypertension.    PT Comments    Patient anxious with all movement. Agreeable to do what he can. Requires min assist with LEs to perform supine to sit due to pain. Min assist +2 with sit to stand with cues for hand placement and safety. He was able to walk 4 feet, then pivoted to [facility], then 3 feet with RW, max cues and min/mod assist of two for safety. Patient has difficulty putting weight on R LE due to pain. He will benefit from continued skilled PT while here to improve functional mobility.       Follow Up Recommendations  SNF;Supervision/Assistance - 24 hour     Equipment Recommendations  Other (comment)(TBD)    Recommendations for Other Services       Precautions / Restrictions Precautions Precautions: Fall Precaution Comments: monitor HR (pt gets anxious and HR goes up) was in 140's sitting EOB and went up to 180's with side step up to [facility], returned to 140's when back supine in bed Restrictions Weight Bearing Restrictions: Yes RLE Weight Bearing: Weight bearing as tolerated    Mobility  Bed Mobility Overal bed mobility: Needs Assistance Bed Mobility: Supine to Sit     Supine to sit: +2 for physical assistance;Min assist     General bed mobility comments: assist BLE management, mainly due to pain  Transfers Overall transfer level: Needs assistance Equipment used: Rolling walker (2 wheeled) Transfers: Sit to/from [employer] Transfers Sit to Stand: +2 physical assistance;Min assist Stand pivot transfers: +2 physical assistance;Min assist       General transfer comment: pt performed sit<>stand x3 this session with MIN +2; cues  for hand placement and body mechanics during functional mobility; poor safety awareness during functional mobility  Ambulation/Gait Ambulation/Gait assistance: Min assist;+2 physical assistance;+2 safety/equipment Gait Distance (Feet): 5 Feet Assistive device: Rolling walker (2 wheeled) Gait Pattern/deviations: Decreased stance time - right;Decreased weight shift to right Gait velocity: decreased   General Gait Details: ambulated 4 feet forward then sat and rested, performed standing pivot to BSC. Then ambulated 3 feet forward to get back into recliner. Impulsive with mobility due to anxiety/fear.   Stairs             Wheelchair Mobility    Modified Rankin (Stroke Patients Only)       Balance Overall balance assessment: Needs assistance Sitting-balance support: Feet supported;Single extremity supported Sitting balance-Leahy Scale: Good Sitting balance - Comments: static sitting balance with supervision   Standing balance support: Bilateral upper extremity supported Standing balance-Leahy Scale: Poor Standing balance comment: reliant on BUE                            Cognition Arousal/Alertness: Awake/alert Behavior During Therapy: Anxious Overall Cognitive Status: Within Functional Limits for tasks assessed Area of Impairment: Safety/judgement                         Safety/Judgement: Decreased awareness of safety     General Comments: poor safety awareness during functional mobility; pt letting go of RW trying to sit on [facility] too soon      Exercises Other Exercises  Other Exercises: B LE exercises with assist: AP, heel slides, hip abd/add, QS, GS x 10 reps each    General Comments        Pertinent Vitals/Pain Pain Assessment: Faces Faces Pain Scale: Hurts even more Pain Location: R hip with activity, reports no pain when at rest Pain Descriptors / Indicators: Grimacing;Guarding;Discomfort;Sore;Moaning Pain Intervention(s): Limited  activity within patient's tolerance;Monitored during session;Repositioned    Home Living                      Prior Function            PT Goals (current goals can now be found in the care plan section) Acute Rehab PT Goals Patient Stated Goal: to go home and be able to take care of my wife PT Goal Formulation: With patient Time For Goal Achievement: 06/21/19 Potential to Achieve Goals: Fair Progress towards PT goals: Progressing toward goals    Frequency    Min 3X/week      PT Plan Current plan remains appropriate    Co-evaluation PT/OT/SLP Co-Evaluation/Treatment: Yes Reason for Co-Treatment: For patient/therapist safety;To address functional/ADL transfers;Complexity of the patient's impairments (multi-system involvement) PT goals addressed during session: Mobility/safety with mobility;Balance;Proper use of DME OT goals addressed during session: ADL's and self-care      AM-PAC PT "6 Clicks" Mobility   Outcome Measure  Help needed turning from your back to your side while in a flat bed without using bedrails?: A Little Help needed moving from lying on your back to sitting on the side of a flat bed without using bedrails?: A Little Help needed moving to and from a bed to a chair (including a wheelchair)?: A Lot Help needed standing up from a chair using your arms (e.g., wheelchair or bedside chair)?: A Little Help needed to walk in hospital room?: A Lot Help needed climbing 3-5 steps with a railing? : Total 6 Click Score: 14    End of Session Equipment Utilized During Treatment: Gait belt;Oxygen Activity Tolerance: Patient limited by pain;Other (comment)(limited by anxiety) Patient left: in chair;with chair alarm set;with call bell/phone within reach Nurse Communication: Mobility status PT Visit Diagnosis: Unsteadiness on feet (R26.81);Muscle weakness (generalized) (M62.81);Difficulty in walking, not elsewhere classified (R26.2);Pain;Other abnormalities of  gait and mobility (R26.89);History of falling (Z91.81) Pain - Right/Left: Right Pain - part of body: Hip     Time: 1300-1330 PT Time Calculation (min) (ACUTE ONLY): 30 min  Charges:  $Gait Training: 8-22 mins $Therapeutic Exercise: 8-22 mins                     Todd Bright, PT, GCS 06/11/19,2:40 PM

## 2019-06-11 NOTE — Progress Notes (Signed)
Occupational Therapy Treatment Patient Details Name: Todd Bright MRN: [id number] DOB: March 31, 1941 Today's Date: 06/11/2019    History of present illness 78 y.o male who presented from home after falling in the garden with R hip fx s/p intramedullary nail intertrochanteric. PMH includes arrythmia, chronic A-fib, chronic kidney disease and hypertension.   OT comments  Pt seen with PT this session to progress functional mobility. Overall, pt MIN-MOD +2 for functional mobility and ADLs. Pt very anxious with movement and demonstrates poor safety awareness during functional mobility. Pt not wanting to WB through RLE despite education on WBAT status, suspect this self limiting behavior is likely hindering further functional mobility and progression towards OT goals. Pt requires MAX verbal cues for hand placement and body mechanics during functional transfers for safety. DC plan to SNF remains appropriate. Will continue to follow for acute OT needs.    Follow Up Recommendations  SNF;Supervision/Assistance - 24 hour    Equipment Recommendations  Other (comment)    Recommendations for Other Services      Precautions / Restrictions Precautions Precautions: Fall Precaution Comments: monitor HR (pt gets anxious and HR goes up) was in 140's sitting EOB and went up to 180's with side step up to [facility], returned to 140's when back supine in bed Restrictions Weight Bearing Restrictions: Yes RLE Weight Bearing: Weight bearing as tolerated       Mobility Bed Mobility Overal bed mobility: Needs Assistance Bed Mobility: Supine to Sit     Supine to sit: +2 for physical assistance;Min assist     General bed mobility comments: assist BLE management  Transfers Overall transfer level: Needs assistance Equipment used: Rolling walker (2 wheeled) Transfers: Sit to/from Stand Sit to Stand: +2 physical assistance;Min assist         General transfer comment: pt performed sit<>stand x3 this session with  MIN +2; cues for hand placement and body mechanics during functional mobility; poor safety awareness during functional mobility    Balance Overall balance assessment: Needs assistance Sitting-balance support: Feet supported;No upper extremity supported Sitting balance-Leahy Scale: Good Sitting balance - Comments: static sitting balance with supervision   Standing balance support: Bilateral upper extremity supported;During functional activity Standing balance-Leahy Scale: Poor Standing balance comment: reliant on BUE                           ADL either performed or assessed with clinical judgement   ADL Overall ADL's : Needs assistance/impaired                         Toilet Transfer: +2 for physical assistance;RW;BSC;Maximal assistance;Moderate assistance Toilet Transfer Details (indicate cue type and reason): very anxious/ shaky with movment; MAX verbal cues for hand placement on RW during functional mobility and safety throughout transfer as pt wanting to let go of RW during transfer   Toileting - Clothing Manipulation Details (indicate cue type and reason): Mod A sit<>stand; no clean up needed     Functional mobility during ADLs: Moderate assistance;Maximal assistance;+2 for physical assistance General ADL Comments: pt fearful and shaky during functional mobility; suspect MOD-MAX for further ADLs; pt not wanting to WB on RLE making ADLs/ functional mobility difficult     Vision Patient Visual Report: No change from baseline     Perception     Praxis      Cognition Arousal/Alertness: Awake/alert Behavior During Therapy: Anxious Overall Cognitive Status: Within Functional Limits for tasks assessed  Area of Impairment: Safety/judgement                         Safety/Judgement: Decreased awareness of safety     General Comments: poor safety awareness during functional mobility; pt letting go of RW trying to sit on BSC too soon         Exercises     Shoulder Instructions       General Comments      Pertinent Vitals/ Pain       Pain Assessment: Faces Faces Pain Scale: Hurts even more Pain Location: R hip Pain Descriptors / Indicators: Moaning;Discomfort;Grimacing;Guarding;Sore Pain Intervention(s): Monitored during session;Repositioned;Limited activity within patient's tolerance  Home Living                                          Prior Functioning/Environment              Frequency  Min 2X/week        Progress Toward Goals  OT Goals(current goals can now be found in the care plan section)  Progress towards OT goals: Progressing toward goals  Acute Rehab OT Goals Time For Goal Achievement: 06/21/19 Potential to Achieve Goals: Good  Plan      Co-evaluation    PT/OT/SLP Co-Evaluation/Treatment: Yes Reason for Co-Treatment: Complexity of the patient's impairments (multi-system involvement);To address functional/ADL transfers   OT goals addressed during session: ADL's and self-care      AM-PAC OT "6 Clicks" Daily Activity     Outcome Measure   Help from another person eating meals?: None Help from another person taking care of personal grooming?: A Little Help from another person toileting, which includes using toliet, bedpan, or urinal?: A Lot Help from another person bathing (including washing, rinsing, drying)?: A Lot Help from another person to put on and taking off regular upper body clothing?: A Little Help from another person to put on and taking off regular lower body clothing?: Total 6 Click Score: 15    End of Session Equipment Utilized During Treatment: Gait belt;Rolling walker  OT Visit Diagnosis: Unsteadiness on feet (R26.81);Other abnormalities of gait and mobility (R26.89);Pain   Activity Tolerance Patient limited by pain;Other (comment)(limited by anxiety when moving)   Patient Left in chair;with call bell/phone within reach;with chair alarm set    Nurse Communication          Time: 0277-4128 OT Time Calculation (min): 27 min  Charges: OT General Charges $OT Visit: 1 Visit OT Treatments $Self Care/Home Management : 8-22 mins  Aileen Pilot, [place] [phone]

## 2019-06-11 NOTE — Discharge Summary (Signed)
Physician Discharge Summary  Todd Bright ZOX:[id number] DOB: 04-May-1941 DOA: 06/05/2019  PCP: Joette Catching, MD  Admit date: 06/05/2019 Discharge date: 06/11/2019  Admitted From:  Home Disposition:   SNF  Recommendations for Outpatient Follow-up:  1. Follow up with PCP in 1-2 weeks 2. Please obtain BMP/CBC in one week your next doctors visit.   Ortho recommends-Lovenox 40 mg every day for 30 days for DVT prophylaxis.  Weightbearing as tolerated on the right lower extremity.  Follow-up in 2 weeks.  Dressing to be left intact until follow-up, keep it dry.  Scripts in the chart   Discharge Condition: Stable CODE STATUS:  Full code Diet recommendation:  cardiac  Brief/Interim Summary: 78 year old with history of essential hypertension, atrial fibrillation/paroxysmal not on anticoagulation came after a fall sustaining a hip fracture.  Patient also has lot of general anxiety.  Underwent intramedullary nailing of the hip on 9/3.  Currently pending placement   Discharge Diagnoses:  Principal Problem:   Closed intertrochanteric fracture of hip, right, initial encounter [facility]) Active Problems:   Atrial fibrillation, chronic   Essential hypertension   CKD (chronic kidney disease), stage III (HCC)   AKI (acute kidney injury) (HCC)  Right hip fracture status post mechanical fall status post intramedullary nailing of the hip on 9/3. -Tolerated the procedure well.  Pain control, bowel regimen. -PT/OT recommending rehab, case manager aware.  repeat Call with negative, sniff placement.  Ortho recommends-Lovenox 40 mg every day for 30 days for DVT prophylaxis.  Weightbearing as tolerated on the right lower extremity.  Follow-up in 2 weeks.  Dressing to be left intact until follow-up, keep it dry.  Scripts in the chart  Atrial fibrillation with RVR, intermittent-resolved - Previously offered Xarelto by PCP but never felt it.  Continue home medication.  Advised him to discuss it again with his  PCP if he wants to get back on long-term anticoagulation.  Vitamin D deficiency -Supplement started  Generalized anxiety - On Ativan at home as needed  Acute kidney injury, resolved Mild rhabdomyolysis secondary to fall -Admission creatinine 1.75.  - CK very minimally elevated.  Continue to monitor  Essential hypertension -On Cardizem  Consultations:    Orthop  Subjective:  feels okay.  Either to get a rehab  Discharge Exam: Vitals:   06/11/19 0400 06/11/19 0748  BP: (!) 143/90 (!) 131/93  Pulse: 77 66  Resp: 16 16  Temp: 98.5 F (36.9 C) 98.8 F (37.1 C)  SpO2: 98% (!) 88%   Vitals:   06/10/19 1647 06/10/19 1922 06/11/19 0400 06/11/19 0748  BP: (!) 131/99 (!) 141/96 (!) 143/90 (!) 131/93  Pulse: (!) 106 95 77 66  Resp: 17 16 16 16   Temp: 97.7 F (36.5 C) 97.8 F (36.6 C) 98.5 F (36.9 C) 98.8 F (37.1 C)  TempSrc: Oral Oral Oral Oral  SpO2: (!) 83% 99% 98% (!) 88%  Weight:      Height:        General: Pt is alert, awake, not in acute distress Cardiovascular: RRR, S1/S2 +, no rubs, no gallops Respiratory: CTA bilaterally, no wheezing, no rhonchi Abdominal: Soft, NT, ND, bowel sounds + Extremities: no edema, no cyanosis  hip dressing noted without any evidence of bleeding.  Discharge Instructions  Discharge Instructions    Call MD for:  severe uncontrolled pain   Complete by: As directed    Diet - low sodium heart healthy   Complete by: As directed    Discharge instructions   Complete by:  As directed    You were cared for by a hospitalist during your hospital stay. If you have any questions about your discharge medications or the care you received while you were in the hospital after you are discharged, you can call the unit and asked to speak with the hospitalist on call if the hospitalist that took care of you is not available. Once you are discharged, your primary care physician will handle any further medical issues. Please note that NO REFILLS  for any discharge medications will be authorized once you are discharged, as it is imperative that you return to your primary care physician (or establish a relationship with a primary care physician if you do not have one) for your aftercare needs so that they can reassess your need for medications and monitor your lab values.  Please request your Prim.MD to go over all Hospital Tests and Procedure/Radiological results at the follow up, please get all Hospital records sent to your Prim MD by signing hospital release before you go home.  Get CBC, CMP, 2 view Chest X ray checked  by Primary MD during your next visit or SNF MD in 5-7 days ( we routinely change or add medications that can affect your baseline labs and fluid status, therefore we recommend that you get the mentioned basic workup next visit with your PCP, your PCP may decide not to get them or add new tests based on their clinical decision)  On your next visit with your primary care physician please Get Medicines reviewed and adjusted.  If you experience worsening of your admission symptoms, develop shortness of breath, life threatening emergency, suicidal or homicidal thoughts you must seek medical attention immediately by calling 911 or calling your MD immediately  if symptoms less severe.  You Must read complete instructions/literature along with all the possible adverse reactions/side effects for all the Medicines you take and that have been prescribed to you. Take any new Medicines after you have completely understood and accpet all the possible adverse reactions/side effects.   Do not drive, operate heavy machinery, perform activities at heights, swimming or participation in water activities or provide baby sitting services if your were admitted for syncope or siezures until you have seen by Primary MD or a Neurologist and advised to do so again.  Do not drive when taking Pain medications.   Increase activity slowly   Complete by: As  directed      Allergies as of 06/11/2019   No Known Allergies     Medication List    STOP taking these medications   diltiazem 120 MG 24 hr capsule Commonly known as: Cardizem CD   Motrin PM 200-38 MG Tabs Generic drug: Ibuprofen-diphenhydrAMINE Cit   rivaroxaban 20 MG Tabs tablet Commonly known as: XARELTO     TAKE these medications   acetaminophen 325 MG tablet Commonly known as: TYLENOL Take 325-650 mg by mouth every 6 (six) hours as needed for mild pain or headache.   aspirin EC 81 MG tablet Take 81 mg by mouth daily.   bisacodyl 10 MG suppository Commonly known as: DULCOLAX Place 1 suppository (10 mg total) rectally daily as needed for moderate constipation.   diltiazem 30 MG tablet Commonly known as: Cardizem Take 1 tablet (30 mg total) by mouth as needed. Take one tablet by mouth every 4 hours AS NEEDED for A-fib HR > 100 as long as BP > 100. What changed: Another medication with the same name was removed. Continue taking this  medication, and follow the directions you see here.   diltiazem 120 MG tablet Commonly known as: CARDIZEM Take 120 mg by mouth daily. What changed: Another medication with the same name was removed. Continue taking this medication, and follow the directions you see here.   diphenhydrAMINE 25 MG tablet Commonly known as: BENADRYL Take 25 mg by mouth at bedtime as needed (for sleep).   enoxaparin 40 MG/0.4ML injection Commonly known as: LOVENOX Inject 0.4 mLs (40 mg total) into the skin daily for 30 doses. For 30 days post op for DVT prophylaxis   escitalopram 10 MG tablet Commonly known as: LEXAPRO Take 10 mg by mouth daily.   Fish Oil 1000 MG Cpdr Take 1,000 mg by mouth daily.   HYDROcodone-acetaminophen 5-325 MG tablet Commonly known as: Norco Take 1 tablet by mouth every 6 (six) hours as needed for severe pain.   LORazepam 0.5 MG tablet Commonly known as: ATIVAN Take 1 tablet (0.5 mg total) by mouth 3 (three) times daily as  needed for up to 5 days for anxiety.   multivitamin with minerals Tabs tablet Take 1 tablet by mouth daily.   polyethylene glycol 17 g packet Commonly known as: MIRALAX / GLYCOLAX Take 17 g by mouth daily as needed for mild constipation.   senna-docusate 8.6-50 MG tablet Commonly known as: Senokot-S Take 2 tablets by mouth at bedtime as needed for mild constipation or moderate constipation.   simvastatin 10 MG tablet Commonly known as: ZOCOR TAKE 1 TABLET (10 MG TOTAL) DAILY BY MOUTH. PT NEEDS APPT FOR REFILLS What changed:   when to take this  additional instructions       Contact information for follow-up providers    Sheral ApleyMurphy, Timothy D, MD. Schedule an appointment as soon as possible for a visit in 2 weeks.   Specialty: Orthopedic Surgery Contact information: [address] [place] [id number] [phone]        Joette CatchingNyland, Leonard, MD. Schedule an appointment as soon as possible for a visit in 2 week(s).   Specialty: Family Medicine Contact information: [address] [place] [id number] [phone]            Contact information for after-discharge care    Destination    [facility] .   Service: Skilled Nursing Contact information: [address] [place] [id number] [phone]                 No Known Allergies  You were cared for by a hospitalist during your hospital stay. If you have any questions about your discharge medications or the care you received while you were in the hospital after you are discharged, you can call the unit and asked to speak with the hospitalist on call if the hospitalist that took care of you is not available. Once you are discharged, your primary care physician will handle any further medical issues. Please note that no refills for any discharge medications will be authorized once you are discharged, as it is imperative that you return to your primary care  physician (or establish a relationship with a primary care physician if you do not have one) for your aftercare needs so that they can reassess your need for medications and monitor your lab values.   Procedures/Studies: Dg Chest 1 View  Result Date: 06/05/2019 CLINICAL DATA:  Hip fracture, fall EXAM: CHEST  1 VIEW COMPARISON:  05/23/2016 FINDINGS: The heart is slightly enlarged. No focal opacity or pleural effusion.  Bilateral skin fold artifacts. No definite pneumothorax. IMPRESSION: No active disease.  Mild cardiomegaly. Electronically Signed   By: Jasmine Pang M.D.   On: 06/05/2019 17:37   Dg C-arm 1-60 Min  Result Date: 06/06/2019 CLINICAL DATA:  Right hip fracture EXAM: RIGHT FEMUR 2 VIEWS; DG C-ARM 1-60 MIN COMPARISON:  06/05/2019 FINDINGS: Four low resolution intraoperative spot views of the right femur. Total fluoroscopy time was 50 seconds. The images demonstrate intramedullary rod fixation of the right femur for comminuted intertrochanteric fracture. Displaced lesser trochanteric fracture fragment is noted. IMPRESSION: Intraoperative fluoroscopic assistance provided during surgical fixation of right intertrochanteric fracture Electronically Signed   By: Jasmine Pang M.D.   On: 06/06/2019 16:46   Dg Femur, Min 2 Views Right  Result Date: 06/06/2019 CLINICAL DATA:  Right hip fracture EXAM: RIGHT FEMUR 2 VIEWS; DG C-ARM 1-60 MIN COMPARISON:  06/05/2019 FINDINGS: Four low resolution intraoperative spot views of the right femur. Total fluoroscopy time was 50 seconds. The images demonstrate intramedullary rod fixation of the right femur for comminuted intertrochanteric fracture. Displaced lesser trochanteric fracture fragment is noted. IMPRESSION: Intraoperative fluoroscopic assistance provided during surgical fixation of right intertrochanteric fracture Electronically Signed   By: Jasmine Pang M.D.   On: 06/06/2019 16:46   Dg Hips Bilat W Or Wo Pelvis 3-4 Views  Result Date: 06/05/2019 CLINICAL  DATA:  Fall EXAM: DG HIP (WITH OR WITHOUT PELVIS) 3-4V BILAT COMPARISON:  None. FINDINGS: Right hip: Acute comminuted intertrochanteric fracture with displacement of the lesser trochanter. The right femoral head projects in joint. Left hip: No fracture or malalignment. Pubic symphysis and rami appear intact. IMPRESSION: 1. Acute comminuted right intertrochanteric fracture 2. No acute osseous abnormality of the left hip Electronically Signed   By: Jasmine Pang M.D.   On: 06/05/2019 17:37      The results of significant diagnostics from this hospitalization (including imaging, microbiology, ancillary and laboratory) are listed below for reference.     Microbiology: Recent Results (from the past 240 hour(s))  SARS Coronavirus 2 [facility] order, Performed in [facility] hospital lab) Nasopharyngeal Nasopharyngeal Swab     Status: None   Collection Time: 06/05/19  6:27 PM   Specimen: Nasopharyngeal Swab  Result Value Ref Range Status   SARS Coronavirus 2 NEGATIVE NEGATIVE Final    Comment: (NOTE) If result is NEGATIVE SARS-CoV-2 target nucleic acids are NOT DETECTED. The SARS-CoV-2 RNA is generally detectable in upper and lower  respiratory specimens during the acute phase of infection. The lowest  concentration of SARS-CoV-2 viral copies this assay can detect is 250  copies / mL. A negative result does not preclude SARS-CoV-2 infection  and should not be used as the sole basis for treatment or other  patient management decisions.  A negative result may occur with  improper specimen collection / handling, submission of specimen other  than nasopharyngeal swab, presence of viral mutation(s) within the  areas targeted by this assay, and inadequate number of viral copies  (<250 copies / mL). A negative result must be combined with clinical  observations, patient history, and epidemiological information. If result is POSITIVE SARS-CoV-2 target nucleic acids are DETECTED. The SARS-CoV-2 RNA is  generally detectable in upper and lower  respiratory specimens dur ing the acute phase of infection.  Positive  results are indicative of active infection with SARS-CoV-2.  Clinical  correlation with patient history and other diagnostic information is  necessary to determine patient infection status.  Positive results do  not rule out bacterial  infection or co-infection with other viruses. If result is PRESUMPTIVE POSTIVE SARS-CoV-2 nucleic acids MAY BE PRESENT.   A presumptive positive result was obtained on the submitted specimen  and confirmed on repeat testing.  While 2019 novel coronavirus  (SARS-CoV-2) nucleic acids may be present in the submitted sample  additional confirmatory testing may be necessary for epidemiological  and / or clinical management purposes  to differentiate between  SARS-CoV-2 and other Sarbecovirus currently known to infect humans.  If clinically indicated additional testing with an alternate test  methodology [phone]) is advised. The SARS-CoV-2 RNA is generally  detectable in upper and lower respiratory sp ecimens during the acute  phase of infection. The expected result is Negative. Fact Sheet for Patients:  BoilerBrush.com.cy Fact Sheet for Healthcare Providers: https://pope.com/ This test is not yet approved or cleared by the [place] FDA and has been authorized for detection and/or diagnosis of SARS-CoV-2 by FDA under an Emergency Use Authorization (EUA).  This EUA will remain in effect (meaning this test can be used) for the duration of the COVID-19 declaration under Section 564(b)(1) of the Act, 21 U.S.C. section 360bbb-3(b)(1), unless the authorization is terminated or revoked sooner. Performed at [facility], [address] [place], [place] [address]   Surgical pcr screen     Status: None   Collection Time: 06/06/19 12:28 PM   Specimen: Nasal Mucosa; Nasal Swab  Result Value Ref  Range Status   MRSA, PCR NEGATIVE NEGATIVE Final   Staphylococcus aureus NEGATIVE NEGATIVE Final    Comment: (NOTE) The Xpert SA Assay (FDA approved for NASAL specimens in patients 54 years of age and older), is one component of a comprehensive surveillance program. It is not intended to diagnose infection nor to guide or monitor treatment. Performed at [facility], [address], [place], [place] [address]   Novel Coronavirus, NAA (hospital order; send-out to ref lab)     Status: None   Collection Time: 06/10/19 10:07 AM   Specimen: Nasopharyngeal Swab; Respiratory  Result Value Ref Range Status   SARS-CoV-2, NAA NOT DETECTED NOT DETECTED Final    Comment: (NOTE) This nucleic acid amplification test was developed and its performance characteristics determined by [employer]. Nucleic acid amplification tests include PCR and TMA. This test has not been FDA cleared or approved. This test has been authorized by FDA under an Emergency Use Authorization (EUA). This test is only authorized for the duration of time the declaration that circumstances exist justifying the authorization of the emergency use of in vitro diagnostic tests for detection of SARS-CoV-2 virus and/or diagnosis of COVID-19 infection under section 564(b)(1) of the Act, 21 U.S.C. 478GNF-6(O) (1), unless the authorization is terminated or revoked sooner. When diagnostic testing is negative, the possibility of a false negative result should be considered in the context of a patient's recent exposures and the presence of clinical signs and symptoms consistent with COVID-19. An individual without symptoms of COVID- 19 and who is not shedding SARS-CoV-2 vi rus would expect to have a negative (not detected) result in this assay. Performed At: [facility] [address] [place], [place] [id number] Jolene Schimke MD ON:[id number]    Coronavirus Source NASOPHARYNGEAL  Final    Comment: Performed  at [facility], [address], [place], [place] [address]     Labs: BNP (last 3 results) Recent Labs    06/05/19 1645  BNP 194.4*   Basic Metabolic Panel: Recent Labs  Lab 06/06/19 1015  06/07/19 0404 06/08/19 0330 06/09/19 0715 06/10/19 0416  NA 134* 136 134* 135 133*  K 4.3 4.2 4.2 4.0 4.1  CL 104 102 100 98 95*  CO2 19* 21* 23 27 28   GLUCOSE 91 136* 94 88 102*  BUN 15 16 21 21 21   CREATININE 1.10 1.14 1.07 0.96 1.02  CALCIUM 8.5* 8.4* 8.5* 8.3* 8.4*  MG  --  1.8 1.9 2.0 2.0   Liver Function Tests: Recent Labs  Lab 06/05/19 1645  AST 23  ALT 15  ALKPHOS 74  BILITOT 1.4*  PROT 6.5  ALBUMIN 3.3*   No results for input(s): LIPASE, AMYLASE in the last 168 hours. No results for input(s): AMMONIA in the last 168 hours. CBC: Recent Labs  Lab 06/05/19 1645 06/06/19 1015 06/07/19 0404 06/08/19 0330 06/09/19 0715 06/10/19 0416  WBC 11.7* 6.8 8.9 12.2* 8.5 8.6  NEUTROABS 10.1*  --   --   --   --   --   HGB 15.3 14.8 13.1 11.2* 11.5* 11.9*  HCT 45.1 46.3 39.2 32.4* 33.8* 34.5*  MCV 94.9 100.7* 94.5 93.1 95.2 93.5  PLT 197 176 179 163 190 220   Cardiac Enzymes: Recent Labs  Lab 06/06/19 1015  CKTOTAL 487*   BNP: Invalid input(s): POCBNP CBG: No results for input(s): GLUCAP in the last 168 hours. D-Dimer No results for input(s): DDIMER in the last 72 hours. Hgb A1c No results for input(s): HGBA1C in the last 72 hours. Lipid Profile No results for input(s): CHOL, HDL, LDLCALC, TRIG, CHOLHDL, LDLDIRECT in the last 72 hours. Thyroid function studies No results for input(s): TSH, T4TOTAL, T3FREE, THYROIDAB in the last 72 hours.  Invalid input(s): FREET3 Anemia work up No results for input(s): VITAMINB12, FOLATE, FERRITIN, TIBC, IRON, RETICCTPCT in the last 72 hours. Urinalysis No results found for: COLORURINE, APPEARANCEUR, LABSPEC, PHURINE, GLUCOSEU, HGBUR, BILIRUBINUR, KETONESUR, PROTEINUR, UROBILINOGEN, NITRITE, LEUKOCYTESUR Sepsis  Labs Invalid input(s): PROCALCITONIN,  WBC,  LACTICIDVEN Microbiology Recent Results (from the past 240 hour(s))  SARS Coronavirus 2 [facility] order, Performed in [facility] hospital lab) Nasopharyngeal Nasopharyngeal Swab     Status: None   Collection Time: 06/05/19  6:27 PM   Specimen: Nasopharyngeal Swab  Result Value Ref Range Status   SARS Coronavirus 2 NEGATIVE NEGATIVE Final    Comment: (NOTE) If result is NEGATIVE SARS-CoV-2 target nucleic acids are NOT DETECTED. The SARS-CoV-2 RNA is generally detectable in upper and lower  respiratory specimens during the acute phase of infection. The lowest  concentration of SARS-CoV-2 viral copies this assay can detect is 250  copies / mL. A negative result does not preclude SARS-CoV-2 infection  and should not be used as the sole basis for treatment or other  patient management decisions.  A negative result may occur with  improper specimen collection / handling, submission of specimen other  than nasopharyngeal swab, presence of viral mutation(s) within the  areas targeted by this assay, and inadequate number of viral copies  (<250 copies / mL). A negative result must be combined with clinical  observations, patient history, and epidemiological information. If result is POSITIVE SARS-CoV-2 target nucleic acids are DETECTED. The SARS-CoV-2 RNA is generally detectable in upper and lower  respiratory specimens dur ing the acute phase of infection.  Positive  results are indicative of active infection with SARS-CoV-2.  Clinical  correlation with patient history and other diagnostic information is  necessary to determine patient infection status.  Positive results do  not rule out bacterial infection or  co-infection with other viruses. If result is PRESUMPTIVE POSTIVE SARS-CoV-2 nucleic acids MAY BE PRESENT.   A presumptive positive result was obtained on the submitted specimen  and confirmed on repeat testing.  While 2019 novel  coronavirus  (SARS-CoV-2) nucleic acids may be present in the submitted sample  additional confirmatory testing may be necessary for epidemiological  and / or clinical management purposes  to differentiate between  SARS-CoV-2 and other Sarbecovirus currently known to infect humans.  If clinically indicated additional testing with an alternate test  methodology [phone]) is advised. The SARS-CoV-2 RNA is generally  detectable in upper and lower respiratory sp ecimens during the acute  phase of infection. The expected result is Negative. Fact Sheet for Patients:  StrictlyIdeas.no Fact Sheet for Healthcare Providers: BankingDealers.co.za This test is not yet approved or cleared by the [place] FDA and has been authorized for detection and/or diagnosis of SARS-CoV-2 by FDA under an Emergency Use Authorization (EUA).  This EUA will remain in effect (meaning this test can be used) for the duration of the COVID-19 declaration under Section 564(b)(1) of the Act, 21 U.S.C. section 360bbb-3(b)(1), unless the authorization is terminated or revoked sooner. Performed at [facility], [place] [address], [place], [place] [address]   Surgical pcr screen     Status: None   Collection Time: 06/06/19 12:28 PM   Specimen: Nasal Mucosa; Nasal Swab  Result Value Ref Range Status   MRSA, PCR NEGATIVE NEGATIVE Final   Staphylococcus aureus NEGATIVE NEGATIVE Final    Comment: (NOTE) The Xpert SA Assay (FDA approved for NASAL specimens in patients 51 years of age and older), is one component of a comprehensive surveillance program. It is not intended to diagnose infection nor to guide or monitor treatment. Performed at [facility], [place] [address], [place], [place] [address]   Novel Coronavirus, NAA (hospital order; send-out to ref lab)     Status: None   Collection Time: 06/10/19 10:07 AM   Specimen: Nasopharyngeal Swab; Respiratory  Result  Value Ref Range Status   SARS-CoV-2, NAA NOT DETECTED NOT DETECTED Final    Comment: (NOTE) This nucleic acid amplification test was developed and its performance characteristics determined by [employer]. Nucleic acid amplification tests include PCR and TMA. This test has not been FDA cleared or approved. This test has been authorized by FDA under an Emergency Use Authorization (EUA). This test is only authorized for the duration of time the declaration that circumstances exist justifying the authorization of the emergency use of in vitro diagnostic tests for detection of SARS-CoV-2 virus and/or diagnosis of COVID-19 infection under section 564(b)(1) of the Act, 21 U.S.C. 510CHE-5(I) (1), unless the authorization is terminated or revoked sooner. When diagnostic testing is negative, the possibility of a false negative result should be considered in the context of a patient's recent exposures and the presence of clinical signs and symptoms consistent with COVID-19. An individual without symptoms of COVID- 19 and who is not shedding SARS-CoV-2 vi rus would expect to have a negative (not detected) result in this assay. Performed At: [facility] [address] [place], [place] [id number] Rush Farmer MD IR:[id number]    [place]  Final    Comment: Performed at [facility], [place] [address], [place], [place] [address]     Time coordinating discharge:  I have spent 35 minutes face to face with the patient and on the ward discussing the patients care, assessment, plan and disposition with  other care givers. >50% of the time was devoted counseling the patient about the risks and benefits of treatment/Discharge disposition and coordinating care.   SIGNED:   Dimple Nanas, MD  [facility] 06/11/2019, 2:29 PM   If 7PM-7AM, please contact night-coverage [email or web address]

## 2019-07-18 ENCOUNTER — Encounter: Payer: Self-pay | Admitting: Physical Therapy

## 2019-07-18 ENCOUNTER — Ambulatory Visit: Payer: Medicare Other | Attending: Internal Medicine | Admitting: Physical Therapy

## 2019-07-18 ENCOUNTER — Other Ambulatory Visit: Payer: Self-pay

## 2019-07-18 DIAGNOSIS — M25651 Stiffness of right hip, not elsewhere classified: Secondary | ICD-10-CM | POA: Diagnosis present

## 2019-07-18 DIAGNOSIS — R2681 Unsteadiness on feet: Secondary | ICD-10-CM | POA: Diagnosis present

## 2019-07-18 NOTE — Therapy (Signed)
[facility] [place], [place], [address] Phone: [phone]   Fax:  [phone]  Physical Therapy Evaluation  Patient Details  Name: Todd Bright MRN: [id number] Date of Birth: [birth year] Referring Provider (PT): Sharlyne Cai   Encounter Date: 07/18/2019  PT End of Session - 07/18/19 1651    Visit Number  1    Number of Visits  16    Date for PT Re-Evaluation  10/16/19    Authorization Type  FOTO AT LEAST EVERY 5TH VISIT.  PROGRESS NOTE AT 10TH VISIT.  KX MODIFIER AFTER 15 VISITS.    PT Start Time  0147    PT Stop Time  0225    PT Time Calculation (min)  38 min    Activity Tolerance  Patient tolerated treatment well    Behavior During Therapy  [facility]FL for tasks assessed/performed       Past Medical History:  Diagnosis Date  . Arrhythmia   . Atrial fibrillation ([place])   . Cataracts, both eyes   . CKD (chronic kidney disease), stage III   . Hypertension     Past Surgical History:  Procedure Laterality Date  . CARDIOVERSION    . INTRAMEDULLARY (IM) NAIL INTERTROCHANTERIC Right 06/06/2019   Procedure: INTRAMEDULLARY (IM) NAIL INTERTROCHANTRIC;  Surgeon: Renette Butters, MD;  Location: [place];  Service: Orthopedics;  Laterality: Right;    There were no vitals filed for this visit.   Subjective Assessment - 07/18/19 1700    Subjective  COVID-19 screen performed prior to patient entering clinic.  The patient present to the clinic today s/p ORIF of a right intertrochanteric fracture.  He reportedly fell when trying to pull a weed on 06/05/19 and underwent surgery on 06/06/19.  He had a stay in a nursing facility.  He reports no pain at rest today but can have some pain with certain righ thip movements.  He is walking with bilaterally axillary crutches.  His caregiver states he was using a FWW but bent forward and pushed with the walker well in front of him.    Pertinent History  Atrial fib, HTN, DM.    How long can you walk  comfortably?  Short distances within home.    Patient Stated Goals  Walk again.    Currently in Pain?  No/denies         [facility] PT Assessment - 07/18/19 0001      Assessment   Medical Diagnosis  Femur fracture, right.    Referring Provider (PT)  Sharlyne Cai    Onset Date/Surgical Date  --   06/05/19.     Precautions   Precautions  Fall      Restrictions   Weight Bearing Restrictions  No      Balance Screen   Has the patient fallen in the past 6 months  Yes    How many times?  --   1.   Has the patient had a decrease in activity level because of a fear of falling?   Yes    Is the patient reluctant to leave their home because of a fear of falling?   Yes      [place] residence      Prior Function   Level of Independence  Independent      Posture/Postural Control   Posture/Postural Control  Postural limitations    Postural Limitations  Flexed trunk      ROM / Strength  AROM / PROM / Strength  AROM;Strength      AROM   Overall AROM Comments  Right knee extension= -12 degrees.  Right hip flexion= 80 degrees.      Strength   Overall Strength Comments  Right hip flexion= 2+/5, abduction= 3-/5, right knee extension= 2+ to 3-/5.      Bed Mobility   Bed Mobility  Sit to Supine    Sit to Supine  --   Assistance to LE's.     Transfers   Transfers  Sit to Stand    Sit to Stand  4: Min assist      Ambulation/Gait   Gait Comments  The patient is walking with bilaterally axillary crutches with a step to gait pattern with CGA assist required for safety.                Objective measurements completed on examination: See above findings.      [facility] - 07/18/19 0001      Exercises   Exercises  Knee/Hip      Knee/Hip Exercises: Aerobic   Nustep  Level 3 x 10 minutes.               PT Short Term Goals - 07/18/19 1736      PT SHORT TERM GOAL #1   Title  STG's=LTG's.         PT Long Term Goals - 07/18/19 1737      PT LONG TERM GOAL #1   Title  Independent with a HEP.    Time  8    Period  Weeks    Status  New      PT LONG TERM GOAL #2   Title  Improve right knee extension to at least -5 degrees.    Time  8    Period  Weeks    Status  New      PT LONG TERM GOAL #3   Title  Perform a reciprocating stair gait with one railing.    Time  8    Period  Weeks    Status  New      PT LONG TERM GOAL #4   Title  Walk a community distance with pain not > 2-3/10    Time  8    Period  Weeks    Status  New      PT LONG TERM GOAL #5   Title  Sit to supine and supine to sit with supervision only.    Time  8    Period  Weeks    Status  New      PT LONG TERM GOAL #6   Title  10 sit to stand transitions with no more than supervision.    Time  8    Period  Weeks    Status  New      PT LONG TERM GOAL #7   Title  Walk 250 feet in clinic with a straight cane with supervision only.    Time  8    Period  Weeks    Status  New             Plan - 07/18/19 1728    Clinical Impression Statement  The patient presents to OPPT s/p right femoral intertrochanteric fracture.  He underwent an ORIF on 06/06/19.  He is walking with CGA assist using bilateral axillary crutches with a step to gait pattern.  He has limitation of right hip and  knee range of motion.  He demonstrates weakness in hhis right hip and knee.  He does not report pain at rest but does experience pain with hip motion.  His functional mobility is impaired at this time.  Patient will benefit from skilled physical therapy intervention to address deficits and pain.    Personal Factors and Comorbidities  Comorbidity 1;Comorbidity 2    Comorbidities  Atrial fib, HTN, DM.    Examination-Activity Limitations  Bed Mobility;Stairs;Transfers;Locomotion Level    Examination-Participation Restrictions  Other    Stability/Clinical Decision Making  Stable/Uncomplicated    Clinical Decision Making  Low     Rehab Potential  Good    PT Frequency  2x / week    PT Duration  8 weeks    PT Treatment/Interventions  ADLs/Self Care Home Management;Cryotherapy;Gait training;Ultrasound;Moist Heat;Stair training;Functional mobility training;Therapeutic activities;Therapeutic exercise;Manual techniques;Patient/family education;Passive range of motion    PT Next Visit Plan  Nustep, right knee stretching to improve extension, Gait and balance activities.  Modalities and STW/M as needed to right hip.    Consulted and Agree with Plan of Care  Patient       Patient will benefit from skilled therapeutic intervention in order to improve the following deficits and impairments:  Pain, Decreased range of motion, Decreased strength, Decreased activity tolerance, Abnormal gait, Difficulty walking  Visit Diagnosis: Stiffness of right hip, not elsewhere classified - Plan: PT plan of care cert/re-cert  Unsteadiness on feet - Plan: PT plan of care cert/re-cert     Problem List Patient Active Problem List   Diagnosis Date Noted  . Closed intertrochanteric fracture of hip, right, initial encounter (HCC) 06/05/2019  . Atrial fibrillation, chronic (HCC) 06/05/2019  . Essential hypertension 06/05/2019  . CKD (chronic kidney disease), stage III 06/05/2019  . AKI (acute kidney injury) (HCC) 06/05/2019    Octavion Mollenkopf, [place] MPT 07/18/2019, 5:44 PM  [facility] [address] [place], [place], [address] Phone: [phone]   Fax:  [phone]  Name: Todd Bright MRN: [id number] Date of Birth: 08/18/1941

## 2019-07-23 ENCOUNTER — Ambulatory Visit: Payer: Medicare Other | Admitting: Physical Therapy

## 2019-07-23 ENCOUNTER — Other Ambulatory Visit: Payer: Self-pay

## 2019-07-23 DIAGNOSIS — M25651 Stiffness of right hip, not elsewhere classified: Secondary | ICD-10-CM

## 2019-07-23 DIAGNOSIS — R2681 Unsteadiness on feet: Secondary | ICD-10-CM

## 2019-07-23 NOTE — Therapy (Signed)
[facility] [place], [place], [address] Phone: [phone]   Fax:  [phone]  Physical Therapy Treatment  Patient Details  Name: Todd Bright MRN: [id number] Date of Birth: 1941/08/22 Referring Provider (PT): Sharlyne Cai   Encounter Date: 07/23/2019  PT End of Session - 07/23/19 5885    Visit Number  2    Number of Visits  16    Date for PT Re-Evaluation  10/16/19    Authorization Type  FOTO AT LEAST EVERY 5TH VISIT.  PROGRESS NOTE AT 10TH VISIT.  KX MODIFIER AFTER 15 VISITS.    PT Start Time  0135    PT Stop Time  0234    PT Time Calculation (min)  59 min    Activity Tolerance  Patient tolerated treatment well    Behavior During Therapy  [facility] for tasks assessed/performed       Past Medical History:  Diagnosis Date  . Arrhythmia   . Atrial fibrillation ([place])   . Cataracts, both eyes   . CKD (chronic kidney disease), stage III   . Hypertension     Past Surgical History:  Procedure Laterality Date  . CARDIOVERSION    . INTRAMEDULLARY (IM) NAIL INTERTROCHANTERIC Right 06/06/2019   Procedure: INTRAMEDULLARY (IM) NAIL INTERTROCHANTRIC;  Surgeon: Renette Butters, MD;  Location: [place];  Service: Orthopedics;  Laterality: Right;    There were no vitals filed for this visit.  Subjective Assessment - 07/23/19 1553    Subjective  COVID-19 screen performed prior to patient entering clinic.  Been doing home exercises.    Pertinent History  Atrial fib, HTN, DM.    How long can you walk comfortably?  Short distances within home.    Patient Stated Goals  Walk again.    Currently in Pain?  No/denies                       [facility] Adult PT Treatment/Exercise - 07/23/19 0001      Exercises   Exercises  Knee/Hip;Other Exercises    Other Exercises   Gait training in clinic with bilateral axillary crutches on level terrain 30 feet x 3.      Knee/Hip Exercises: Aerobic   Nustep  Level 3 x 10 minutes.      Knee/Hip Exercises: Standing   Forward Step Up Limitations  6 inch step-ups x 3 minutes.      Knee/Hip Exercises: Seated   Abd/Adduction Limitations  Red theraband resisted hip abduction x 3 minutes.      Manual Therapy   Manual Therapy  Soft tissue mobilization    Soft tissue mobilization  In the left sdly position with a folded pillow between for comfort:  STW/M x 17 minutes to affected right hip region.          Balance Exercises - 07/23/19 1555      Balance Exercises: Standing   Other Standing Exercises  Rockerboard in parallel bars x 3 minutes.          PT Short Term Goals - 07/18/19 1736      PT SHORT TERM GOAL #1   Title  STG's=LTG's.        PT Long Term Goals - 07/18/19 1737      PT LONG TERM GOAL #1   Title  Independent with a HEP.    Time  8    Period  Weeks    Status  New  PT LONG TERM GOAL #2   Title  Improve right knee extension to at least -5 degrees.    Time  8    Period  Weeks    Status  New      PT LONG TERM GOAL #3   Title  Perform a reciprocating stair gait with one railing.    Time  8    Period  Weeks    Status  New      PT LONG TERM GOAL #4   Title  Walk a community distance with pain not > 2-3/10    Time  8    Period  Weeks    Status  New      PT LONG TERM GOAL #5   Title  Sit to supine and supine to sit with supervision only.    Time  8    Period  Weeks    Status  New      PT LONG TERM GOAL #6   Title  10 sit to stand transitions with no more than supervision.    Time  8    Period  Weeks    Status  New      PT LONG TERM GOAL #7   Title  Walk 250 feet in clinic with a straight cane with supervision only.    Time  8    Period  Weeks    Status  New            Plan - 07/23/19 1608    Clinical Impression Statement  The patient did well today. Most notably was an increase in his right LE step length.  CGA needed for safety while ambulating with axillary crutches.    Personal Factors and Comorbidities   Comorbidity 1;Comorbidity 2    Comorbidities  Atrial fib, HTN, DM.    Examination-Activity Limitations  Bed Mobility;Stairs;Transfers;Locomotion Level    Examination-Participation Restrictions  Other    Stability/Clinical Decision Making  Stable/Uncomplicated    Rehab Potential  Good    PT Frequency  2x / week    PT Duration  8 weeks    PT Treatment/Interventions  ADLs/Self Care Home Management;Cryotherapy;Gait training;Ultrasound;Moist Heat;Stair training;Functional mobility training;Therapeutic activities;Therapeutic exercise;Manual techniques;Patient/family education;Passive range of motion    PT Next Visit Plan  Nustep, right knee stretching to improve extension, Gait and balance activities.  Modalities and STW/M as needed to right hip.    Consulted and Agree with Plan of Care  Patient       Patient will benefit from skilled therapeutic intervention in order to improve the following deficits and impairments:  Pain, Decreased range of motion, Decreased strength, Decreased activity tolerance, Abnormal gait, Difficulty walking  Visit Diagnosis: Stiffness of right hip, not elsewhere classified  Unsteadiness on feet     Problem List Patient Active Problem List   Diagnosis Date Noted  . Closed intertrochanteric fracture of hip, right, initial encounter (HCC) 06/05/2019  . Atrial fibrillation, chronic (HCC) 06/05/2019  . Essential hypertension 06/05/2019  . CKD (chronic kidney disease), stage III 06/05/2019  . AKI (acute kidney injury) (HCC) 06/05/2019    Berkeley Veldman, [place] MPT 07/23/2019, 4:19 PM  [facility] [address] [place], [place], [address] Phone: [phone]   Fax:  [phone]  Name: Todd Bright MRN: [id number] Date of Birth: Jan 04, 1941

## 2019-07-25 ENCOUNTER — Other Ambulatory Visit: Payer: Self-pay

## 2019-07-25 ENCOUNTER — Encounter: Payer: Self-pay | Admitting: Physical Therapy

## 2019-07-25 ENCOUNTER — Ambulatory Visit: Payer: Medicare Other | Admitting: Physical Therapy

## 2019-07-25 DIAGNOSIS — R2681 Unsteadiness on feet: Secondary | ICD-10-CM

## 2019-07-25 DIAGNOSIS — M25651 Stiffness of right hip, not elsewhere classified: Secondary | ICD-10-CM | POA: Diagnosis not present

## 2019-07-25 NOTE — Therapy (Signed)
[facility] [place], [place], [address] Phone: [phone]   Fax:  [phone]  Physical Therapy Treatment  Patient Details  Name: Todd Bright MRN: [id number] Date of Birth: April 11, 1941 Referring Provider (PT): Sharlyne Cai   Encounter Date: 07/25/2019  PT End of Session - 07/25/19 1426    Visit Number  3    Number of Visits  16    Date for PT Re-Evaluation  10/16/19    Authorization Type  FOTO AT LEAST EVERY 5TH VISIT.  PROGRESS NOTE AT 10TH VISIT.  KX MODIFIER AFTER 15 VISITS.    PT Start Time  1540    PT Stop Time  1616   limited due to late start to treatment   PT Time Calculation (min)  36 min    Equipment Utilized During Treatment  Other (comment)   B axillary crutches   Activity Tolerance  Patient tolerated treatment well;Patient limited by fatigue    Behavior During Therapy  [facility] for tasks assessed/performed       Past Medical History:  Diagnosis Date  . Arrhythmia   . Atrial fibrillation ([place])   . Cataracts, both eyes   . CKD (chronic kidney disease), stage III   . Hypertension     Past Surgical History:  Procedure Laterality Date  . CARDIOVERSION    . INTRAMEDULLARY (IM) NAIL INTERTROCHANTERIC Right 06/06/2019   Procedure: INTRAMEDULLARY (IM) NAIL INTERTROCHANTRIC;  Surgeon: Renette Butters, MD;  Location: [place];  Service: Orthopedics;  Laterality: Right;    There were no vitals filed for this visit.  Subjective Assessment - 07/25/19 1425    Subjective  COVID-19 screen performed prior to patient entering clinic. Remains compliant with HEP.    Patient is accompained by:  Family member   Cousin/caregiver, Remo Lipps   Pertinent History  Atrial fib, HTN, DM.    How long can you walk comfortably?  Short distances within home.    Patient Stated Goals  Walk again.    Currently in Pain?  No/denies         [facility] PT Assessment - 07/25/19 0001      Assessment   Medical Diagnosis  Femur fracture,  right.    Referring Provider (PT)  Sharlyne Cai    Onset Date/Surgical Date  06/05/19    Next MD Visit  08/26/2019   08/12/2019 for Dr. Georgina Quint     Precautions   Precautions  Fall      Restrictions   Weight Bearing Restrictions  No                   [facility] Adult PT Treatment/Exercise - 07/25/19 0001      Knee/Hip Exercises: Aerobic   Nustep  L3, seat 9 x10 min      Knee/Hip Exercises: Standing   Hip Flexion  AROM;Right;2 sets;10 reps;Knee bent    Hip Abduction  AROM;Right;2 sets;10 reps;Knee straight    Forward Step Up  Right;10 reps;Hand Hold: 2;Step Height: 4"      Knee/Hip Exercises: Seated   Long Arc Quad  Strengthening;Right;2 sets;10 reps    [employer]  x20 reps 5 sec holds    Clamshell with TheraBand  Red   x20 reps   Knee/Hip Flexion  AROM RLE x20 reps each    Hamstring Curl  Strengthening;Right;2 sets;10 reps;Limitations    Hamstring Limitations  red theraband    Sit to Sand  10 reps;without UE support  PT Short Term Goals - 07/18/19 1736      PT SHORT TERM GOAL #1   Title  STG's=LTG's.        PT Long Term Goals - 07/25/19 1540      PT LONG TERM GOAL #1   Title  Independent with a HEP.    Time  8    Period  Weeks    Status  Achieved      PT LONG TERM GOAL #2   Title  Improve right knee extension to at least -5 degrees.    Time  8    Period  Weeks    Status  On-going      PT LONG TERM GOAL #3   Title  Perform a reciprocating stair gait with one railing.    Time  8    Period  Weeks    Status  On-going      PT LONG TERM GOAL #4   Title  Walk a community distance with pain not > 2-3/10    Time  8    Period  Weeks    Status  On-going      PT LONG TERM GOAL #5   Title  Sit to supine and supine to sit with supervision only.    Time  8    Period  Weeks    Status  On-going      PT LONG TERM GOAL #6   Title  10 sit to stand transitions with no more than supervision.    Time  8    Period  Weeks    Status   On-going      PT LONG TERM GOAL #7   Title  Walk 250 feet in clinic with a straight cane with supervision only.    Time  8    Period  Weeks    Status  On-going            Plan - 07/25/19 1528    Clinical Impression Statement  Patient presented in clinic with no complaints of pain upon arrival. Great amount of general weakness observed throughout treatment. Patient observed ambulating with reduced B stride length due to fatigue and weakness but can make greater strides if he regains control and takes break. Patient very weak with standing forward step ups to 6" step and 4" step. Mid femoral pain began in RLE with standing therex at end of treatment which patient reports occurs with prolonged exercise.    Personal Factors and Comorbidities  Comorbidity 1;Comorbidity 2    Comorbidities  Atrial fib, HTN, DM.    Examination-Activity Limitations  Bed Mobility;Stairs;Transfers;Locomotion Level    Examination-Participation Restrictions  Other    Stability/Clinical Decision Making  Stable/Uncomplicated    Rehab Potential  Good    PT Frequency  2x / week    PT Duration  8 weeks    PT Treatment/Interventions  ADLs/Self Care Home Management;Cryotherapy;Gait training;Ultrasound;Moist Heat;Stair training;Functional mobility training;Therapeutic activities;Therapeutic exercise;Manual techniques;Patient/family education;Passive range of motion    PT Next Visit Plan  Nustep, right knee stretching to improve extension, Gait and balance activities.  Modalities and STW/M as needed to right hip.    Consulted and Agree with Plan of Care  Patient       Patient will benefit from skilled therapeutic intervention in order to improve the following deficits and impairments:  Pain, Decreased range of motion, Decreased strength, Decreased activity tolerance, Abnormal gait, Difficulty walking  Visit Diagnosis: Stiffness of right hip, not elsewhere classified  Unsteadiness on feet     Problem List Patient  Active Problem List   Diagnosis Date Noted  . Closed intertrochanteric fracture of hip, right, initial encounter (HCC) 06/05/2019  . Atrial fibrillation, chronic (HCC) 06/05/2019  . Essential hypertension 06/05/2019  . CKD (chronic kidney disease), stage III 06/05/2019  . AKI (acute kidney injury) (HCC) 06/05/2019    Marvell Fuller, PTA 07/25/2019, 3:40 PM  [facility] [address] [place], [place], [address] Phone: [phone]   Fax:  [phone]  Name: Todd Bright MRN: [id number] Date of Birth: [birth year]

## 2019-07-30 ENCOUNTER — Ambulatory Visit: Payer: Medicare Other | Admitting: Physical Therapy

## 2019-07-30 ENCOUNTER — Other Ambulatory Visit: Payer: Self-pay

## 2019-07-30 DIAGNOSIS — S32029A Unspecified fracture of second lumbar vertebra, initial encounter for closed fracture: Secondary | ICD-10-CM | POA: Diagnosis not present

## 2019-07-30 DIAGNOSIS — R2681 Unsteadiness on feet: Secondary | ICD-10-CM

## 2019-07-30 DIAGNOSIS — M25651 Stiffness of right hip, not elsewhere classified: Secondary | ICD-10-CM

## 2019-07-30 DIAGNOSIS — M8088XA Other osteoporosis with current pathological fracture, vertebra(e), initial encounter for fracture: Secondary | ICD-10-CM | POA: Diagnosis not present

## 2019-07-30 NOTE — Therapy (Addendum)
[facility] [place], [place], [address] Phone: [phone]   Fax:  [phone]  Physical Therapy Treatment PHYSICAL THERAPY DISCHARGE SUMMARY  Visits from Start of Care: 4  Current functional level related to goals / functional outcomes: See below   Remaining deficits: See goals   Education / Equipment: HEP Plan: Patient agrees to discharge.  Patient goals were not met. Patient is being discharged due to not returning since the last visit.  ?????    Gabriela Eves, PT, DPT 11/21/19 Patient Details  Name: Todd Bright MRN: [id number] Date of Birth: 1940-11-18 Referring Provider (PT): Sharlyne Cai   Encounter Date: 07/30/2019  PT End of Session - 07/30/19 1355    Visit Number  4    Number of Visits  16    Date for PT Re-Evaluation  10/16/19    Authorization Type  FOTO AT LEAST EVERY 5TH VISIT.  PROGRESS NOTE AT 10TH VISIT.  KX MODIFIER AFTER 15 VISITS.    PT Start Time  1354   late arrival   PT Stop Time  1432    PT Time Calculation (min)  38 min    Equipment Utilized During Treatment  Other (comment)   bilateral crutches   Activity Tolerance  Patient tolerated treatment well;Patient limited by fatigue    Behavior During Therapy  WFL for tasks assessed/performed       Past Medical History:  Diagnosis Date  . Arrhythmia   . Atrial fibrillation ([place])   . Cataracts, both eyes   . CKD (chronic kidney disease), stage III   . Hypertension     Past Surgical History:  Procedure Laterality Date  . CARDIOVERSION    . INTRAMEDULLARY (IM) NAIL INTERTROCHANTERIC Right 06/06/2019   Procedure: INTRAMEDULLARY (IM) NAIL INTERTROCHANTRIC;  Surgeon: Renette Butters, MD;  Location: [facility];  Service: Orthopedics;  Laterality: Right;    There were no vitals filed for this visit.  Subjective Assessment - 07/30/19 1355    Subjective  COVID-19 screen performed prior to patient entering clinic. Patient reports  falling getting out of bed early Sunday morning.    Patient is accompained by:  Family member   Caregiver, Remo Lipps   Pertinent History  Atrial fib, HTN, DM.    How long can you walk comfortably?  Short distances within home.    Patient Stated Goals  Walk again.    Currently in Pain?  Yes   "sore and comes and goes."        [facility] PT Assessment - 07/30/19 0001      Assessment   Medical Diagnosis  Femur fracture, right.    Referring Provider (PT)  Sharlyne Cai    Onset Date/Surgical Date  06/05/19    Next MD Visit  08/26/2019      Precautions   Precautions  Fall      Restrictions   Weight Bearing Restrictions  No                   [facility] Adult PT Treatment/Exercise - 07/30/19 0001      Knee/Hip Exercises: Aerobic   Nustep  L4, seat 9 x10 min      Knee/Hip Exercises: Seated   Long Arc Quad  Strengthening;Right;2 sets;10 reps    [facility]  x20 reps 5 sec holds    Clamshell with TheraBand  Red    Knee/Hip Flexion  AROM BLE x20 reps each    Hamstring Curl  Strengthening;Right;2 sets;10 reps;Limitations  Hamstring Limitations  red theraband      Manual Therapy   Manual Therapy  Soft tissue mobilization    Soft tissue mobilization  In the left sdly position with a folded pillow between for comfort:  STW/M x 12 mins                PT Short Term Goals - 07/18/19 1736      PT SHORT TERM GOAL #1   Title  STG's=LTG's.        PT Long Term Goals - 07/25/19 1540      PT LONG TERM GOAL #1   Title  Independent with a HEP.    Time  8    Period  Weeks    Status  Achieved      PT LONG TERM GOAL #2   Title  Improve right knee extension to at least -5 degrees.    Time  8    Period  Weeks    Status  On-going      PT LONG TERM GOAL #3   Title  Perform a reciprocating stair gait with one railing.    Time  8    Period  Weeks    Status  On-going      PT LONG TERM GOAL #4   Title  Walk a community distance with pain not > 2-3/10    Time  8     Period  Weeks    Status  On-going      PT LONG TERM GOAL #5   Title  Sit to supine and supine to sit with supervision only.    Time  8    Period  Weeks    Status  On-going      PT LONG TERM GOAL #6   Title  10 sit to stand transitions with no more than supervision.    Time  8    Period  Weeks    Status  On-going      PT LONG TERM GOAL #7   Title  Walk 250 feet in clinic with a straight cane with supervision only.    Time  8    Period  Weeks    Status  On-going            Plan - 07/30/19 1656    Clinical Impression Statement  Patient arrives with generalized pain due to a fall on Sunday. Patient was able to tolerate treatment fairly well despite pain. Patient required min A x2 to stand from nustep and was reporting a fear of falling. Patient's exercises were performed in the seated position secondary to pain. Patient responded well to STW/M to right hip with reports of a decrease of pain at end of session. Patient educated to use night lights in his room or turn the light on when he needs to get out of bed to prevent another fall. Patient and caregiver reported understanding.    Personal Factors and Comorbidities  Comorbidity 1;Comorbidity 2    Comorbidities  Atrial fib, HTN, DM.    Examination-Activity Limitations  Bed Mobility;Stairs;Transfers;Locomotion Level    Examination-Participation Restrictions  Other    Stability/Clinical Decision Making  Stable/Uncomplicated    Clinical Decision Making  Low    Rehab Potential  Good    PT Frequency  2x / week    PT Duration  8 weeks    PT Treatment/Interventions  ADLs/Self Care Home Management;Cryotherapy;Gait training;Ultrasound;Moist Heat;Stair training;Functional mobility training;Therapeutic activities;Therapeutic exercise;Manual techniques;Patient/family education;Passive range of motion  PT Next Visit Plan  Nustep, right knee stretching to improve extension, Gait and balance activities.  Modalities and STW/M as needed to right  hip.    Consulted and Agree with Plan of Care  Patient       Patient will benefit from skilled therapeutic intervention in order to improve the following deficits and impairments:  Pain, Decreased range of motion, Decreased strength, Decreased activity tolerance, Abnormal gait, Difficulty walking  Visit Diagnosis: Stiffness of right hip, not elsewhere classified  Unsteadiness on feet     Problem List Patient Active Problem List   Diagnosis Date Noted  . Closed intertrochanteric fracture of hip, right, initial encounter ([place]) 06/05/2019  . Atrial fibrillation, chronic ([place]) 06/05/2019  . Essential hypertension 06/05/2019  . CKD (chronic kidney disease), stage III 06/05/2019  . AKI (acute kidney injury) ([place]) 06/05/2019    Gabriela Eves, PT, DPT 07/30/2019, 5:07 PM  [facility] [address] [place], [place], [address] Phone: [phone]   Fax:  [phone]  Name: ALEGANDRO MACNAUGHTON MRN: [id number] Date of Birth: [birth year]

## 2019-08-01 ENCOUNTER — Encounter: Payer: Medicare Other | Admitting: Physical Therapy

## 2019-08-02 ENCOUNTER — Other Ambulatory Visit: Payer: Self-pay

## 2019-08-02 ENCOUNTER — Encounter (HOSPITAL_COMMUNITY): Payer: Self-pay | Admitting: Emergency Medicine

## 2019-08-02 ENCOUNTER — Emergency Department (HOSPITAL_COMMUNITY): Payer: Medicare Other

## 2019-08-02 ENCOUNTER — Inpatient Hospital Stay (HOSPITAL_COMMUNITY)
Admission: EM | Admit: 2019-08-02 | Discharge: 2019-08-05 | DRG: 543 | Disposition: A | Discharge status: Home Health | Payer: Medicare Other | Attending: Family Medicine | Admitting: Family Medicine

## 2019-08-02 DIAGNOSIS — Z79899 Other long term (current) drug therapy: Secondary | ICD-10-CM

## 2019-08-02 DIAGNOSIS — Z66 Do not resuscitate: Secondary | ICD-10-CM | POA: Diagnosis present

## 2019-08-02 DIAGNOSIS — S72141A Displaced intertrochanteric fracture of right femur, initial encounter for closed fracture: Secondary | ICD-10-CM | POA: Diagnosis not present

## 2019-08-02 DIAGNOSIS — I480 Paroxysmal atrial fibrillation: Secondary | ICD-10-CM | POA: Diagnosis present

## 2019-08-02 DIAGNOSIS — W19XXXA Unspecified fall, initial encounter: Secondary | ICD-10-CM | POA: Insufficient documentation

## 2019-08-02 DIAGNOSIS — S32029A Unspecified fracture of second lumbar vertebra, initial encounter for closed fracture: Secondary | ICD-10-CM | POA: Diagnosis present

## 2019-08-02 DIAGNOSIS — I482 Chronic atrial fibrillation, unspecified: Secondary | ICD-10-CM | POA: Diagnosis present

## 2019-08-02 DIAGNOSIS — Z20828 Contact with and (suspected) exposure to other viral communicable diseases: Secondary | ICD-10-CM | POA: Diagnosis present

## 2019-08-02 DIAGNOSIS — K5900 Constipation, unspecified: Secondary | ICD-10-CM | POA: Diagnosis present

## 2019-08-02 DIAGNOSIS — Z87891 Personal history of nicotine dependence: Secondary | ICD-10-CM | POA: Diagnosis not present

## 2019-08-02 DIAGNOSIS — M8088XA Other osteoporosis with current pathological fracture, vertebra(e), initial encounter for fracture: Principal | ICD-10-CM | POA: Diagnosis present

## 2019-08-02 DIAGNOSIS — M810 Age-related osteoporosis without current pathological fracture: Secondary | ICD-10-CM | POA: Diagnosis present

## 2019-08-02 DIAGNOSIS — S22000A Wedge compression fracture of unspecified thoracic vertebra, initial encounter for closed fracture: Secondary | ICD-10-CM | POA: Insufficient documentation

## 2019-08-02 DIAGNOSIS — Z8249 Family history of ischemic heart disease and other diseases of the circulatory system: Secondary | ICD-10-CM | POA: Diagnosis not present

## 2019-08-02 DIAGNOSIS — S32029D Unspecified fracture of second lumbar vertebra, subsequent encounter for fracture with routine healing: Secondary | ICD-10-CM | POA: Diagnosis not present

## 2019-08-02 DIAGNOSIS — Z825 Family history of asthma and other chronic lower respiratory diseases: Secondary | ICD-10-CM | POA: Diagnosis not present

## 2019-08-02 DIAGNOSIS — Y92009 Unspecified place in unspecified non-institutional (private) residence as the place of occurrence of the external cause: Secondary | ICD-10-CM | POA: Diagnosis not present

## 2019-08-02 DIAGNOSIS — W010XXA Fall on same level from slipping, tripping and stumbling without subsequent striking against object, initial encounter: Secondary | ICD-10-CM | POA: Diagnosis present

## 2019-08-02 DIAGNOSIS — I129 Hypertensive chronic kidney disease with stage 1 through stage 4 chronic kidney disease, or unspecified chronic kidney disease: Secondary | ICD-10-CM | POA: Diagnosis present

## 2019-08-02 DIAGNOSIS — F419 Anxiety disorder, unspecified: Secondary | ICD-10-CM | POA: Diagnosis present

## 2019-08-02 DIAGNOSIS — I700 Atherosclerosis of aorta: Secondary | ICD-10-CM | POA: Diagnosis present

## 2019-08-02 DIAGNOSIS — N183 Chronic kidney disease, stage 3 unspecified: Secondary | ICD-10-CM | POA: Diagnosis present

## 2019-08-02 DIAGNOSIS — M8000XD Age-related osteoporosis with current pathological fracture, unspecified site, subsequent encounter for fracture with routine healing: Secondary | ICD-10-CM | POA: Diagnosis not present

## 2019-08-02 DIAGNOSIS — E785 Hyperlipidemia, unspecified: Secondary | ICD-10-CM | POA: Diagnosis present

## 2019-08-02 DIAGNOSIS — G14 Postpolio syndrome: Secondary | ICD-10-CM | POA: Diagnosis present

## 2019-08-02 LAB — BASIC METABOLIC PANEL
Anion gap: 12 (ref 5–15)
BUN: 10 mg/dL (ref 8–23)
CO2: 26 mmol/L (ref 22–32)
Calcium: 8.9 mg/dL (ref 8.9–10.3)
Chloride: 102 mmol/L (ref 98–111)
Creatinine, Ser: 0.84 mg/dL (ref 0.61–1.24)
GFR calc Af Amer: >60 mL/min (ref >60)
GFR calc non Af Amer: >60 mL/min (ref >60)
Glucose, Bld: 103 mg/dL — ABNORMAL HIGH (ref 70–99)
Potassium: 4 mmol/L (ref 3.5–5.1)
Sodium: 140 mmol/L (ref 135–145)

## 2019-08-02 LAB — CBC WITH DIFFERENTIAL/PLATELET
Abs Immature Granulocytes: 0.05 10*3/uL (ref 0.00–0.07)
Basophils Absolute: 0 10*3/uL (ref 0.0–0.1)
Basophils Relative: 1 %
Eosinophils Absolute: 0.1 10*3/uL (ref 0.0–0.5)
Eosinophils Relative: 1 %
HCT: 48.1 % (ref 39.0–52.0)
Hemoglobin: 15.9 g/dL (ref 13.0–17.0)
Immature Granulocytes: 1 %
Lymphocytes Relative: 19 %
Lymphs Abs: 1.7 10*3/uL (ref 0.7–4.0)
MCH: 32.1 pg (ref 26.0–34.0)
MCHC: 33.1 g/dL (ref 30.0–36.0)
MCV: 97.2 fL (ref 80.0–100.0)
Monocytes Absolute: 0.6 10*3/uL (ref 0.1–1.0)
Monocytes Relative: 7 %
Neutro Abs: 6.1 10*3/uL (ref 1.7–7.7)
Neutrophils Relative %: 71 %
Platelets: 276 10*3/uL (ref 150–400)
RBC: 4.95 MIL/uL (ref 4.22–5.81)
RDW: 13.2 % (ref 11.5–15.5)
WBC: 8.5 10*3/uL (ref 4.0–10.5)
nRBC: 0 % (ref 0.0–0.2)

## 2019-08-02 LAB — CK: Total CK: 62 U/L (ref 49–397)

## 2019-08-02 LAB — SARS CORONAVIRUS 2 (TAT 6-24 HRS): SARS Coronavirus 2: NEGATIVE

## 2019-08-02 MED ORDER — OMEGA-3-ACID ETHYL ESTERS 1 G PO CAPS
1.0000 g | ORAL_CAPSULE | Freq: Two times a day (BID) | ORAL | Status: DC
  Administered 2019-08-02 – 2019-08-05 (×6): 1 g via ORAL
  Filled 2019-08-02 (×6): qty 1

## 2019-08-02 MED ORDER — ACETAMINOPHEN 325 MG PO TABS
650.0000 mg | ORAL_TABLET | Freq: Once | ORAL | Status: AC
  Administered 2019-08-02: 650 mg via ORAL
  Filled 2019-08-02: qty 2

## 2019-08-02 MED ORDER — DILTIAZEM HCL 60 MG PO TABS
120.0000 mg | ORAL_TABLET | Freq: Once | ORAL | Status: AC
  Administered 2019-08-02: 120 mg via ORAL
  Filled 2019-08-02: qty 2

## 2019-08-02 MED ORDER — DILTIAZEM HCL 25 MG/5ML IV SOLN
10.0000 mg | Freq: Once | INTRAVENOUS | Status: AC
  Administered 2019-08-02: 10 mg via INTRAVENOUS
  Filled 2019-08-02: qty 5

## 2019-08-02 MED ORDER — POLYETHYLENE GLYCOL 3350 17 G PO PACK: 17.0000 g | PACK | Freq: Every day | ORAL | Status: DC | PRN

## 2019-08-02 MED ORDER — OXYCODONE HCL 5 MG PO TABS
5.0000 mg | ORAL_TABLET | Freq: Four times a day (QID) | ORAL | Status: DC | PRN
  Administered 2019-08-02 – 2019-08-03 (×4): 5 mg via ORAL
  Filled 2019-08-02 (×4): qty 1

## 2019-08-02 MED ORDER — FENTANYL CITRATE (PF) 100 MCG/2ML IJ SOLN
50.0000 ug | Freq: Once | INTRAMUSCULAR | Status: AC
  Administered 2019-08-02: 12:00:00 50 ug via INTRAVENOUS
  Filled 2019-08-02: qty 2

## 2019-08-02 MED ORDER — ACETAMINOPHEN 325 MG PO TABS
650.0000 mg | ORAL_TABLET | Freq: Four times a day (QID) | ORAL | Status: DC | PRN
  Administered 2019-08-02 – 2019-08-05 (×2): 650 mg via ORAL
  Filled 2019-08-02 (×2): qty 2

## 2019-08-02 MED ORDER — FISH OIL 1000 MG PO CPDR: 1000.0000 mg | DELAYED_RELEASE_CAPSULE | Freq: Every day | ORAL | Status: DC

## 2019-08-02 MED ORDER — ENOXAPARIN SODIUM 40 MG/0.4ML ~~LOC~~ SOLN
40.0000 mg | SUBCUTANEOUS | Status: DC
  Administered 2019-08-02 – 2019-08-04 (×3): 40 mg via SUBCUTANEOUS
  Filled 2019-08-02 (×3): qty 0.4

## 2019-08-02 MED ORDER — ATORVASTATIN CALCIUM 10 MG PO TABS
5.0000 mg | ORAL_TABLET | Freq: Every evening | ORAL | Status: DC
  Administered 2019-08-02 – 2019-08-04 (×3): 5 mg via ORAL
  Filled 2019-08-02 (×3): qty 1

## 2019-08-02 MED ORDER — SENNOSIDES-DOCUSATE SODIUM 8.6-50 MG PO TABS: 2.0000 | ORAL_TABLET | Freq: Every evening | ORAL | Status: DC | PRN

## 2019-08-02 MED ORDER — SODIUM CHLORIDE 0.9 % IV BOLUS
500.0000 mL | Freq: Once | INTRAVENOUS | Status: AC
  Administered 2019-08-02: 500 mL via INTRAVENOUS

## 2019-08-02 MED ORDER — GABAPENTIN 100 MG PO CAPS
100.0000 mg | ORAL_CAPSULE | Freq: Every day | ORAL | Status: DC
  Administered 2019-08-02 – 2019-08-04 (×3): 100 mg via ORAL
  Filled 2019-08-02 (×3): qty 1

## 2019-08-02 MED ORDER — POLYETHYLENE GLYCOL 3350 17 G PO PACK
17.0000 g | PACK | Freq: Every day | ORAL | Status: DC
  Administered 2019-08-02 – 2019-08-05 (×4): 17 g via ORAL
  Filled 2019-08-02 (×4): qty 1

## 2019-08-02 MED ORDER — DILTIAZEM HCL 60 MG PO TABS
120.0000 mg | ORAL_TABLET | Freq: Every day | ORAL | Status: DC
  Administered 2019-08-03 – 2019-08-04 (×2): 120 mg via ORAL
  Filled 2019-08-02 (×2): qty 2

## 2019-08-02 MED ORDER — ADULT MULTIVITAMIN W/MINERALS CH
1.0000 | ORAL_TABLET | Freq: Every day | ORAL | Status: DC
  Administered 2019-08-02 – 2019-08-05 (×4): 1 via ORAL
  Filled 2019-08-02 (×4): qty 1

## 2019-08-02 MED ORDER — ESCITALOPRAM OXALATE 10 MG PO TABS
10.0000 mg | ORAL_TABLET | Freq: Every day | ORAL | Status: DC
  Administered 2019-08-02 – 2019-08-05 (×4): 10 mg via ORAL
  Filled 2019-08-02 (×4): qty 1

## 2019-08-02 MED ORDER — DIAZEPAM 2 MG PO TABS
2.0000 mg | ORAL_TABLET | Freq: Once | ORAL | Status: AC
  Administered 2019-08-02: 08:00:00 2 mg via ORAL
  Filled 2019-08-02: qty 1

## 2019-08-02 NOTE — ED Notes (Signed)
This RN will contact the patients family member Nephi Savage.

## 2019-08-02 NOTE — ED Notes (Signed)
Patient transported to CT 

## 2019-08-02 NOTE — ED Notes (Signed)
TLSO brace at bedside. It is supposed to go home with him at discharge.

## 2019-08-02 NOTE — ED Triage Notes (Signed)
Pt BIB [facility] from home. Pt experienced a mechanical fall. Pt has no power and could not see and slipped and fell. Pt complaining of bilateral lower back spasms. Skin tear to right arm. HR in 140s with EMS pt states that is normal for him.

## 2019-08-02 NOTE — Consult Note (Signed)
Reason for Consult:L2 fx Referring Physician: edp  Gladis RiffleGale B Zehring is an 78 y.o. male.   HPI:  78 year old male presented to the ED today after sustaining a fall. History very difficult to obtain from the patient because of some confusion. He thinks he last fell 2 days ago but according to the nurse he fell today. States that he lost his balance and slid down the wall landing on his buttocks. EMS transported him to the ED. He lives at home with his wife. He has had recent hip surgery in September. He reports having muscle spasms in his back with no radicular pain NT or weakness down his legs. No family at bedside to help with history of event. He does report falling a couple times over the last several weeks. Patient seems anxious and worried that he cant recall events correctly.  Past Medical History:  Diagnosis Date  . Arrhythmia   . Atrial fibrillation ([facility])   . Cataracts, both eyes   . CKD (chronic kidney disease), stage III   . Hypertension     Past Surgical History:  Procedure Laterality Date  . CARDIOVERSION    . INTRAMEDULLARY (IM) NAIL INTERTROCHANTERIC Right 06/06/2019   Procedure: INTRAMEDULLARY (IM) NAIL INTERTROCHANTRIC;  Surgeon: Sheral ApleyMurphy, Timothy D, MD;  Location: [facility];  Service: Orthopedics;  Laterality: Right;    No Known Allergies  Social History   Tobacco Use  . Smoking status: Former Smoker    Quit date: 05/21/2015    Years since quitting: 4.2  . Smokeless tobacco: Never Used  Substance Use Topics  . Alcohol use: No    Family History  Problem Relation Age of Onset  . Heart attack Mother   . Emphysema Father   . Cancer Sister      Review of Systems  Positive ROS: as above All other systems have been reviewed and were otherwise negative with the exception of those mentioned in the HPI and as above.  Objective: Vital signs in last 24 hours: Temp:  [98 F (36.7 C)] 98 F (36.7 C) (10/30 0750) Pulse Rate:  [68] 68 (10/30 1000) Resp:  [12-18] 17 (10/30  1000) BP: (132-160)/(92-133) 139/92 (10/30 1000) SpO2:  [90 %-100 %] 92 % (10/30 1000) Weight:  [77.1 kg] 77.1 kg (10/30 0747)  General Appearance: Alert, cooperative, no distress, appears stated age Head: Normocephalic, without obvious abnormality, atraumatic Back: Symmetric, no curvature, ROM normal, no CVA tenderness Lungs:  respirations unlabored Heart: Regular rate and rhythm Pulses: 2+ and symmetric all extremities Skin: Skin color, texture, turgor normal, no rashes or lesions, skin tears on right forearm  NEUROLOGIC:   Mental status: A&O x4, no aphasia, good attention span, Memory and fund of knowledge imparied Motor Exam - grossly normal, normal tone and bulk Sensory Exam - grossly normal Reflexes: not tested Coordination - not tested Gait - not tested Balance -not tested Cranial Nerves: I: smell Not tested  II: visual acuity  OS: na    OD: na  II: visual fields Full to confrontation  II: pupils Equal, round, reactive to light  III,VII: ptosis None  III,IV,VI: extraocular muscles  Full ROM  V: mastication   V: facial light touch sensation    V,VII: corneal reflex    VII: facial muscle function - upper    VII: facial muscle function - lower   VIII: hearing   IX: soft palate elevation    IX,X: gag reflex   XI: trapezius strength    XI: sternocleidomastoid  strength   XI: neck flexion strength    XII: tongue strength      Data Review Lab Results  Component Value Date   WBC 8.5 08/02/2019   HGB 15.9 08/02/2019   HCT 48.1 08/02/2019   MCV 97.2 08/02/2019   PLT 276 08/02/2019   Lab Results  Component Value Date   NA 140 08/02/2019   K 4.0 08/02/2019   CL 102 08/02/2019   CO2 26 08/02/2019   BUN 10 08/02/2019   CREATININE 0.84 08/02/2019   GLUCOSE 103 (H) 08/02/2019   No results found for: INR, PROTIME  Radiology: Dg Lumbar Spine Complete  Result Date: 08/02/2019 CLINICAL DATA:  Pain following fall EXAM: LUMBAR SPINE - COMPLETE 4+ VIEW COMPARISON:   None FINDINGS: Frontal, lateral, spot lumbosacral lateral, and bilateral oblique views were obtained. There is an acute appearing fracture of the L2 vertebral body with marked wedging at this level. A bony fragment is noted anterior to the L2 vertebral body. There is a lucency extending through a portion of the L3 vertebral body concerning for nondisplaced fracture in this area. There is a probable old wedge fracture at T12. There is 6 mm of anterolisthesis of L5 on S1. No other spondylolisthesis. There is moderately severe disc space narrowing at L5-S1. There is mild disc space narrowing at L2-3. There is facet osteoarthritic change to varying degrees at all levels, most notably at L4-5 and L5-S1 bilaterally. There is aortic atherosclerosis. IMPRESSION: Severe wedging of the L2 vertebral body which appears acute with a bony fragment anterior to the L2 vertebral body. Nondisplaced fracture apparent in the mid to posterior aspect of the L3 vertebral body. Probable old wedge fracture at T12. 6 mm of anterolisthesis of L5 on S1 which is likely due to underlying spondylosis. Severe disc space narrowing noted at L5-S1. Multifocal facet arthropathy noted. Aortic Atherosclerosis (ICD10-I70.0). Electronically Signed   By: Bretta Bang III M.D.   On: 08/02/2019 09:10   Ct Lumbar Spine Wo Contrast  Result Date: 08/02/2019 CLINICAL DATA:  Larey Seat.  Injured back. EXAM: CT LUMBAR SPINE WITHOUT CONTRAST TECHNIQUE: Multidetector CT imaging of the lumbar spine was performed without intravenous contrast administration. Multiplanar CT image reconstructions were also generated. COMPARISON:  Lumbar radiographs 08/02/2019 FINDINGS: Segmentation: There are five lumbar type vertebral bodies. The last full intervertebral disc space is labeled L5-S1. Alignment: Bilateral pars defects at L5 with a grade 1 spondylolisthesis. There is also mild degenerative anterolisthesis of L4. The other vertebral bodies are normally aligned.  Vertebrae: There is a remote appearing compression fracture of T12. No retropulsion or canal compromise. Acute compression fracture of L2 with approximately 60% central compression of the superior endplate. There is mild retropulsion of the posterosuperior aspect of vertebral body but no significant canal stenosis. No involvement of the pedicles or posterior elements. The other lumbar vertebral bodies are maintained. No acute fracture. There is moderate to advanced osteoporosis noted. Multilevel facet disease. Paraspinal and other soft tissues: No significant paraspinal or retroperitoneal findings. Small amount of paraspinal hematoma at L2. Disc levels: T12-L1: No significant findings. L1-2: Mild retropulsion involving the posterosuperior aspect of L2 with mild flattening of the ventral thecal sac but no significant canal stenosis. No significant foraminal stenosis. L2-3: Mild diffuse annular bulge with flattening of the ventral thecal sac but no large disc protrusion, spinal or significant foraminal stenosis. L3-4: Bulging annulus and facet disease but no significant spinal or foraminal stenosis. L4-5: Postoperative changes with a right-sided laminectomy defect. There is a  bulging uncovered disc, facet disease and ligamentum flavum thickening contributing to mild spinal and bilateral lateral recess stenosis. There is also mild bilateral foraminal stenosis. L5-S1: Bulging uncovered disc but the canal is generous and there is no significant spinal stenosis. Severe facet disease. Mild to moderate bilateral foraminal stenosis. IMPRESSION: 1. Acute L2 compression fracture with mild retropulsion but no significant canal compromise. 2. Remote T12 compression fracture. 3. Osteoporosis. 4. 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 5. Mild multifactorial spinal and bilateral lateral recess stenosis and bilateral foraminal stenosis at L4-5. 6.  Mild to moderate bilateral foraminal stenosis at L5-S1. Electronically Signed   By: Marijo Sanes M.D.   On: 08/02/2019 10:39    Assessment/Plan: Confused 78 year old male presented to the ED today after sustaining a fall at home. CT lumbar reveals an acute L2 compression fracture with 60% vertebral height loss with mild retropulsion no canal stenosis or posterior element involvement. Chronic bilateral pars defect L5. Would recommend lumbar corset brace. He is neurologically intact on exam. This will likely heal in a brace given time. No surgical intervention needed. Recommend medicine admit for pain control and therapy.   Ocie Cornfield Elinda Bunten 08/02/2019 12:10 PM

## 2019-08-02 NOTE — Progress Notes (Signed)
Orthopedic Tech Progress Note Patient Details:  Todd Bright January 31, 1941 [id number] Called in order to [facility] for a TLSO brace Patient ID: Todd Bright, male   DOB: 07-10-1941, 78 y.o.   MRN: [id number]   Janit Pagan 08/02/2019, 11:22 AM

## 2019-08-02 NOTE — Discharge Summary (Addendum)
[facility] Discharge Summary  Patient name: Todd Bright Medical record number: [id number] Date of birth: March 16, 1941 Age: 78 y.o. Gender: male Date of Admission: 08/02/2019  Date of Discharge: 08/05/19   Admitting Physician: Blane Ohara McDiarmid, MD  Primary Care Provider: Dione Housekeeper, MD Consultants: Neurosurgery   Indication for Hospitalization: Back pain, fall  Discharge Diagnoses/Problem List:  L2 Compression fracture Paroxysmal atrial fibrillation Anxiety Constipation Hyperlipidemia  Chronic kidney disease stage III Osteoporosis   Disposition: Home with home health   Discharge Condition: Stable   Discharge Exam:  GEN: pleasant elderly male, in no acute distress  CV: regular rate and rhythm, no murmurs appreciated  RESP: no increased work of breathing, clear to ascultation bilaterally  ABD: Bowel sounds present. Soft, Nontender, Nondistended.  MSK: no lower extremity edema, or cyanosis noted SKIN: warm, dry, right hip surgical scar  NEURO:moves all extremities appropriately PSYCH: anxious, normal affect    Brief Hospital Course:  Todd Bright is a 78 y.o. male admitted for L2 vertebral compression fracture and nondisplaced fracture of the mid to posterior aspect of L3 after falling at home. Patient without neurological deficits.  Patient with chronic T12 fracture seen on imaging as well as right hip fracture with intramedullary surgical fixation 06/2019.  Patient was evaluated by neurosurgery however surgery was not recommended.  Patient was given a lumbar corset brace and is to follow-up with neurosurgery in 2 weeks.  Pain was controlled with Tylenol and oxycodone.  Patient denies back pain and had not used pain medication in the preceding 24 hours prior to discharge.  Patient to have home PT and OT.  Patient is medically stable and appropriate for discharge at this time.   Issues for Follow Up:  1.  Patient to follow-up with neurosurgery  in 2 weeks and has a lumbar corset brace.  Patient to have PT and OT at home.  Please see to it the patient follows up with neurosurgery and receives PT and OT at home. 2.  Patient taking 0.5 mg lorazepam 3 times daily.  Lorazepam bottle was found empty admission. Consider weaning patient this medication. 3.  Patient reports ongoing anxiety.  Increased patient's Lexapro to 20 mg daily.  Reassess patient's anxiety in the outpatient setting. 4.  Consider restarting bisphosphonates after fracture healing has occurred.    Significant Procedures: None   Significant Labs and Imaging:  Recent Labs  Lab 08/02/19 0814 08/03/19 0432 08/04/19 0955  WBC 8.5 7.0 7.9  HGB 15.9 14.2 14.6  HCT 48.1 41.7 43.2  PLT 276 283 318   Recent Labs  Lab 08/02/19 0814 08/03/19 0432 08/04/19 0955  NA 140 136 135  K 4.0 3.6 3.8  CL 102 99 99  CO2 26 25 25   GLUCOSE 103* 79 92  BUN 10 12 12   CREATININE 0.84 0.89 1.07  CALCIUM 8.9 8.7* 8.7*    Dg Lumbar Spine 2-3 Views  Result Date: 08/04/2019 CLINICAL DATA:  L2 vertebral fracture. EXAM: LUMBAR SPINE - 2-3 VIEW COMPARISON:  August 02, 2019 FINDINGS: Anterolisthesis of L5 versus S1 is again identified measuring 11 mm on this study versus 6 mm August 02, 2019. Anterolisthesis of L4 versus L5 measures 8 mm, not seen previously. No other malalignment. The acute compression fracture of L2 appears to have progressed in the interval with increased loss of height of L2. Anterior wedging of T12 is stable. IMPRESSION: 1. Interval progression of the acute L2 compression fracture with increasing loss of height centrally.  2. Chronic compression fracture of T12. 3. The anterolisthesis of L5 versus S1 is now grade 2 measuring 11 mm on this study versus 6 mm on the August 02, 2019 study. 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 4. 8 mm of anterolisthesis of L4 versus L5 is seen today but not previously. Electronically Signed   By: Gerome Samavid  Williams III M.D   On: 08/04/2019 18:33    Dg Lumbar Spine Complete  Result Date: 08/02/2019 CLINICAL DATA:  Pain following fall EXAM: LUMBAR SPINE - COMPLETE 4+ VIEW COMPARISON:  None FINDINGS: Frontal, lateral, spot lumbosacral lateral, and bilateral oblique views were obtained. There is an acute appearing fracture of the L2 vertebral body with marked wedging at this level. A bony fragment is noted anterior to the L2 vertebral body. There is a lucency extending through a portion of the L3 vertebral body concerning for nondisplaced fracture in this area. There is a probable old wedge fracture at T12. There is 6 mm of anterolisthesis of L5 on S1. No other spondylolisthesis. There is moderately severe disc space narrowing at L5-S1. There is mild disc space narrowing at L2-3. There is facet osteoarthritic change to varying degrees at all levels, most notably at L4-5 and L5-S1 bilaterally. There is aortic atherosclerosis. IMPRESSION: Severe wedging of the L2 vertebral body which appears acute with a bony fragment anterior to the L2 vertebral body. Nondisplaced fracture apparent in the mid to posterior aspect of the L3 vertebral body. Probable old wedge fracture at T12. 6 mm of anterolisthesis of L5 on S1 which is likely due to underlying spondylosis. Severe disc space narrowing noted at L5-S1. Multifocal facet arthropathy noted. Aortic Atherosclerosis (ICD10-I70.0). Electronically Signed   By: Bretta BangWilliam  Woodruff III M.D.   On: 08/02/2019 09:10   Ct Lumbar Spine Wo Contrast  Result Date: 08/02/2019 CLINICAL DATA:  Larey SeatFell.  Injured back. EXAM: CT LUMBAR SPINE WITHOUT CONTRAST TECHNIQUE: Multidetector CT imaging of the lumbar spine was performed without intravenous contrast administration. Multiplanar CT image reconstructions were also generated. COMPARISON:  Lumbar radiographs 08/02/2019 FINDINGS: Segmentation: There are five lumbar type vertebral bodies. The last full intervertebral disc space is labeled L5-S1. Alignment: Bilateral pars defects at L5  with a grade 1 spondylolisthesis. There is also mild degenerative anterolisthesis of L4. The other vertebral bodies are normally aligned. Vertebrae: There is a remote appearing compression fracture of T12. No retropulsion or canal compromise. Acute compression fracture of L2 with approximately 60% central compression of the superior endplate. There is mild retropulsion of the posterosuperior aspect of vertebral body but no significant canal stenosis. No involvement of the pedicles or posterior elements. The other lumbar vertebral bodies are maintained. No acute fracture. There is moderate to advanced osteoporosis noted. Multilevel facet disease. Paraspinal and other soft tissues: No significant paraspinal or retroperitoneal findings. Small amount of paraspinal hematoma at L2. Disc levels: T12-L1: No significant findings. L1-2: Mild retropulsion involving the posterosuperior aspect of L2 with mild flattening of the ventral thecal sac but no significant canal stenosis. No significant foraminal stenosis. L2-3: Mild diffuse annular bulge with flattening of the ventral thecal sac but no large disc protrusion, spinal or significant foraminal stenosis. L3-4: Bulging annulus and facet disease but no significant spinal or foraminal stenosis. L4-5: Postoperative changes with a right-sided laminectomy defect. There is a bulging uncovered disc, facet disease and ligamentum flavum thickening contributing to mild spinal and bilateral lateral recess stenosis. There is also mild bilateral foraminal stenosis. L5-S1: Bulging uncovered disc but the canal is generous and  there is no significant spinal stenosis. Severe facet disease. Mild to moderate bilateral foraminal stenosis. IMPRESSION: 1. Acute L2 compression fracture with mild retropulsion but no significant canal compromise. 2. Remote T12 compression fracture. 3. Osteoporosis. 4. 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 5. Mild multifactorial spinal and bilateral lateral recess stenosis and bilateral foraminal stenosis at L4-5. 6. Mild to moderate bilateral foraminal stenosis at L5-S1. Electronically Signed   By: Rudie Meyer M.D.   On: 08/02/2019 10:39      Results/Tests Pending at Time of Discharge: None  Discharge Medications:  Allergies as of 08/05/2019   No Known Allergies     Medication List    STOP taking these medications   acetaminophen 325 MG tablet Commonly known as: TYLENOL     TAKE these medications   diltiazem 120 MG tablet Commonly known as: CARDIZEM Take 120 mg by mouth daily.   escitalopram 10 MG tablet Commonly known as: LEXAPRO Take 2 tablets (20 mg total) by mouth daily. What changed: how much to take   Fish Oil 1000 MG Cpdr Take 1,000 mg by mouth daily.   gabapentin 100 MG capsule Commonly known as: NEURONTIN Take 100 mg by mouth at bedtime. What changed: Another medication with the same name was added. Make sure you understand how and when to take each.   gabapentin 100 MG capsule Commonly known as: NEURONTIN Take 2 capsules (200 mg total) by mouth 3 (three) times daily. What changed: You were already taking a medication with the same name, and this prescription was added. Make sure you understand how and when to take each.   LORazepam 0.5 MG tablet Commonly known as: ATIVAN Take 0.5 mg by mouth 3 (three) times daily.   Melatonin 3 MG Tabs Take 3 mg by mouth at bedtime.   multivitamin with minerals Tabs tablet Take 1 tablet by mouth daily.   polyethylene glycol 17 g packet Commonly known as: MIRALAX / GLYCOLAX Take 17 g by mouth daily as needed for mild constipation.   senna-docusate 8.6-50 MG tablet Commonly known as: Senokot-S Take 2 tablets by mouth at bedtime as needed for mild constipation or moderate constipation.   simvastatin 10 MG tablet Commonly known as: ZOCOR TAKE 1 TABLET (10 MG TOTAL) DAILY BY MOUTH. PT NEEDS  APPT FOR REFILLS What changed:   when to take this  additional instructions       Discharge Instructions: Please refer to Patient Instructions section of EMR for full details.  Patient was counseled important signs and symptoms that should prompt return to medical care, changes in medications, dietary instructions, activity restrictions, and follow up appointments.   Follow-Up Appointments: Follow-up Information    Tia Alert, MD. Schedule an appointment as soon as possible for a visit in 2 week(s).   Specialty: Neurosurgery Contact information: [address] [place] [address] [phone]        [facility] Follow up.   Specialty: [facility] Contact information: [address] [place] [address] [phone]           Katha Cabal, MD 08/05/2019, 5:38 PM PGY-1, [facility] ----------------------------------------------------------------------------------------------- Upper Level Addendum: I have seen and evaluated this patient along with Dr. Rachael Darby and reviewed the above note  Myrene Buddy MD PGY-3 Family Medicine Resident

## 2019-08-02 NOTE — ED Provider Notes (Signed)
[facility] Provider Note   CSN: [id number] Arrival date & time: 08/02/19  [id number]     History   Chief Complaint Chief Complaint  Patient presents with   Back Pain   Fall    HPI Todd Bright is a 78 y.o. male.     The history is provided by the patient.  Back Pain Location:  Lumbar spine Quality:  Stiffness Radiates to:  Does not radiate Pain severity:  Mild Onset quality:  Gradual Timing:  Intermittent Progression:  Waxing and waning Chronicity:  Recurrent Context: recent injury   Relieved by:  Nothing Worsened by:  Nothing Ineffective treatments:  None tried Associated symptoms: no abdominal pain, no abdominal swelling, no bladder incontinence, no bowel incontinence, no chest pain, no dysuria, no fever, no headaches, no leg pain, no numbness, no paresthesias, no pelvic pain, no perianal numbness, no tingling, no weakness and no weight loss     Past Medical History:  Diagnosis Date   Arrhythmia    Atrial fibrillation (HCC)    Cataracts, both eyes    CKD (chronic kidney disease), stage III    Hypertension     Patient Active Problem List   Diagnosis Date Noted   Closed intertrochanteric fracture of hip, right, initial encounter (HCC) 06/05/2019   Atrial fibrillation, chronic (HCC) 06/05/2019   Essential hypertension 06/05/2019   CKD (chronic kidney disease), stage III 06/05/2019   AKI (acute kidney injury) (HCC) 06/05/2019    Past Surgical History:  Procedure Laterality Date   CARDIOVERSION     INTRAMEDULLARY (IM) NAIL INTERTROCHANTERIC Right 06/06/2019   Procedure: INTRAMEDULLARY (IM) NAIL INTERTROCHANTRIC;  Surgeon: Sheral ApleyMurphy, Timothy D, MD;  Location: [facility] OR;  Service: Orthopedics;  Laterality: Right;        Home Medications    Prior to Admission medications   Medication Sig Start Date End Date Taking? Authorizing Provider  acetaminophen (TYLENOL) 325 MG tablet Take 325-650 mg by mouth every 6 (six)  hours as needed for mild pain or headache.    [provider]  aspirin EC 81 MG tablet Take 81 mg by mouth daily.    [provider]  bisacodyl (DULCOLAX) 10 MG suppository Place 1 suppository (10 mg total) rectally daily as needed for moderate constipation. Patient not taking: Reported on 07/18/2019 06/08/19   Dimple NanasAmin, Ankit Chirag, MD  diltiazem (CARDIZEM) 120 MG tablet Take 120 mg by mouth daily. 05/09/19   [provider]  diltiazem (CARDIZEM) 30 MG tablet Take 1 tablet (30 mg total) by mouth as needed. Take one tablet by mouth every 4 hours AS NEEDED for A-fib HR > 100 as long as BP > 100. Patient not taking: Reported on 06/05/2019 05/26/16   Newman Niparroll, Donna C, NP  diphenhydrAMINE (BENADRYL) 25 MG tablet Take 25 mg by mouth at bedtime as needed (for sleep).    [provider]  enoxaparin (LOVENOX) 40 MG/0.4ML injection Inject 0.4 mLs (40 mg total) into the skin daily for 30 doses. For 30 days post op for DVT prophylaxis 06/06/19 07/06/19  Albina BilletMartensen, Henry Calvin III, PA-C  escitalopram (LEXAPRO) 10 MG tablet Take 10 mg by mouth daily. 05/09/19   [provider]  gabapentin (NEURONTIN) 100 MG capsule Take 100 mg by mouth at bedtime.    [provider]  HYDROcodone-acetaminophen (NORCO) 5-325 MG tablet Take 1 tablet by mouth every 6 (six) hours as needed for severe pain. Patient not taking: Reported on 07/18/2019 06/06/19   Albina BilletMartensen, Henry Calvin  III, PA-C  Multiple Vitamin (MULTIVITAMIN WITH MINERALS) TABS tablet Take 1 tablet by mouth daily.    [provider]  Omega-3 Fatty Acids (FISH OIL) 1000 MG CPDR Take 1,000 mg by mouth daily.    [provider]  polyethylene glycol (MIRALAX / GLYCOLAX) 17 g packet Take 17 g by mouth daily as needed for mild constipation. 06/08/19   Amin, Ankit Chirag, MD  senna-docusate (SENOKOT-S) 8.6-50 MG tablet Take 2 tablets by mouth at bedtime as needed for mild constipation or moderate constipation. 06/08/19    Amin, Ankit Chirag, MD  simvastatin (ZOCOR) 10 MG tablet TAKE 1 TABLET (10 MG TOTAL) DAILY BY MOUTH. PT NEEDS APPT FOR REFILLS Patient taking differently: Take 10 mg by mouth every evening.  09/06/17   Sherran Needs, NP    Family History Family History  Problem Relation Age of Onset   Heart attack Mother    Emphysema Father    Cancer Sister     Social History Social History   Tobacco Use   Smoking status: Former Smoker    Quit date: 05/21/2015    Years since quitting: 4.2   Smokeless tobacco: Never Used  Substance Use Topics   Alcohol use: No   Drug use: No     Allergies   Patient has no known allergies.   Review of Systems Review of Systems  Constitutional: Negative for chills, fever and weight loss.  HENT: Negative for ear pain and sore throat.   Eyes: Negative for pain and visual disturbance.  Respiratory: Negative for cough and shortness of breath.   Cardiovascular: Negative for chest pain and palpitations.  Gastrointestinal: Negative for abdominal pain, bowel incontinence and vomiting.  Genitourinary: Negative for bladder incontinence, dysuria, hematuria and pelvic pain.  Musculoskeletal: Positive for back pain. Negative for arthralgias.  Skin: Negative for color change and rash.  Neurological: Negative for tingling, seizures, syncope, weakness, numbness, headaches and paresthesias.  All other systems reviewed and are negative.    Physical Exam Updated Vital Signs  ED Triage Vitals  Enc Vitals Group     BP 08/02/19 0750 (!) 160/133     Pulse Rate 08/02/19 0750 68     Resp 08/02/19 0750 18     Temp 08/02/19 0750 98 F (36.7 C)     Temp Source 08/02/19 0750 Oral     SpO2 08/02/19 0747 100 %     Weight 08/02/19 0747 170 lb (77.1 kg)     Height 08/02/19 0747 6' (1.829 m)     Head Circumference --      Peak Flow --      Pain Score 08/02/19 0747 7     Pain Loc --      Pain Edu? --      Excl. in [place]? --     Physical Exam Vitals signs and  nursing note reviewed.  Constitutional:      Appearance: He is well-developed.  HENT:     Head: Normocephalic and atraumatic.     Nose: Nose normal.     Mouth/Throat:     Mouth: Mucous membranes are moist.  Eyes:     Extraocular Movements: Extraocular movements intact.     Conjunctiva/sclera: Conjunctivae normal.     Pupils: Pupils are equal, round, and reactive to light.  Neck:     Musculoskeletal: Normal range of motion and neck supple.  Cardiovascular:     Rate and Rhythm: Normal rate and regular rhythm.     Pulses: Normal  pulses.     Heart sounds: Normal heart sounds. No murmur.  Pulmonary:     Effort: Pulmonary effort is normal. No respiratory distress.     Breath sounds: Normal breath sounds.  Abdominal:     General: There is no distension.     Palpations: Abdomen is soft.     Tenderness: There is no abdominal tenderness.  Musculoskeletal: Normal range of motion.        General: Tenderness present.     Comments: No midline spinal tenderness, tenderness to paraspinal lumbar muscles  Skin:    General: Skin is warm and dry.     Capillary Refill: Capillary refill takes less than 2 seconds.  Neurological:     General: No focal deficit present.     Mental Status: He is alert and oriented to person, place, and time.     Cranial Nerves: No cranial nerve deficit.     Sensory: No sensory deficit.     Motor: No weakness.     Coordination: Coordination normal.     Comments: 5+ out of 5 strength throughout, normal sensation, no drift, normal finger-to-nose finger      ED Treatments / Results  Labs (all labs ordered are listed, but only abnormal results are displayed) Labs Reviewed  BASIC METABOLIC PANEL - Abnormal; Notable for the following components:      Result Value   Glucose, Bld 103 (*)    All other components within normal limits  SARS CORONAVIRUS 2 (TAT 6-24 HRS)  CBC WITH DIFFERENTIAL/PLATELET    EKG EKG Interpretation  Date/Time:  Friday August 02 2019  09:18:21 EDT Ventricular Rate:  119 PR Interval:    QRS Duration: 101 QT Interval:  353 QTC Calculation: 528 R Axis:   90 Text Interpretation: Sinus or ectopic atrial tachycardia Ventricular premature complex Confirmed by Virgina Norfolk [phone]) on 08/02/2019 9:41:53 AM   Radiology Dg Lumbar Spine Complete  Result Date: 08/02/2019 CLINICAL DATA:  Pain following fall EXAM: LUMBAR SPINE - COMPLETE 4+ VIEW COMPARISON:  None FINDINGS: Frontal, lateral, spot lumbosacral lateral, and bilateral oblique views were obtained. There is an acute appearing fracture of the L2 vertebral body with marked wedging at this level. A bony fragment is noted anterior to the L2 vertebral body. There is a lucency extending through a portion of the L3 vertebral body concerning for nondisplaced fracture in this area. There is a probable old wedge fracture at T12. There is 6 mm of anterolisthesis of L5 on S1. No other spondylolisthesis. There is moderately severe disc space narrowing at L5-S1. There is mild disc space narrowing at L2-3. There is facet osteoarthritic change to varying degrees at all levels, most notably at L4-5 and L5-S1 bilaterally. There is aortic atherosclerosis. IMPRESSION: Severe wedging of the L2 vertebral body which appears acute with a bony fragment anterior to the L2 vertebral body. Nondisplaced fracture apparent in the mid to posterior aspect of the L3 vertebral body. Probable old wedge fracture at T12. 6 mm of anterolisthesis of L5 on S1 which is likely due to underlying spondylosis. Severe disc space narrowing noted at L5-S1. Multifocal facet arthropathy noted. Aortic Atherosclerosis (ICD10-I70.0). Electronically Signed   By: Bretta Bang III M.D.   On: 08/02/2019 09:10   Ct Lumbar Spine Wo Contrast  Result Date: 08/02/2019 CLINICAL DATA:  Larey Seat.  Injured back. EXAM: CT LUMBAR SPINE WITHOUT CONTRAST TECHNIQUE: Multidetector CT imaging of the lumbar spine was performed without intravenous  contrast administration. Multiplanar CT image reconstructions were also  generated. COMPARISON:  Lumbar radiographs 08/02/2019 FINDINGS: Segmentation: There are five lumbar type vertebral bodies. The last full intervertebral disc space is labeled L5-S1. Alignment: Bilateral pars defects at L5 with a grade 1 spondylolisthesis. There is also mild degenerative anterolisthesis of L4. The other vertebral bodies are normally aligned. Vertebrae: There is a remote appearing compression fracture of T12. No retropulsion or canal compromise. Acute compression fracture of L2 with approximately 60% central compression of the superior endplate. There is mild retropulsion of the posterosuperior aspect of vertebral body but no significant canal stenosis. No involvement of the pedicles or posterior elements. The other lumbar vertebral bodies are maintained. No acute fracture. There is moderate to advanced osteoporosis noted. Multilevel facet disease. Paraspinal and other soft tissues: No significant paraspinal or retroperitoneal findings. Small amount of paraspinal hematoma at L2. Disc levels: T12-L1: No significant findings. L1-2: Mild retropulsion involving the posterosuperior aspect of L2 with mild flattening of the ventral thecal sac but no significant canal stenosis. No significant foraminal stenosis. L2-3: Mild diffuse annular bulge with flattening of the ventral thecal sac but no large disc protrusion, spinal or significant foraminal stenosis. L3-4: Bulging annulus and facet disease but no significant spinal or foraminal stenosis. L4-5: Postoperative changes with a right-sided laminectomy defect. There is a bulging uncovered disc, facet disease and ligamentum flavum thickening contributing to mild spinal and bilateral lateral recess stenosis. There is also mild bilateral foraminal stenosis. L5-S1: Bulging uncovered disc but the canal is generous and there is no significant spinal stenosis. Severe facet disease. Mild to  moderate bilateral foraminal stenosis. IMPRESSION: 1. Acute L2 compression fracture with mild retropulsion but no significant canal compromise. 2. Remote T12 compression fracture. 3. Osteoporosis. 4. 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 5. Mild multifactorial spinal and bilateral lateral recess stenosis and bilateral foraminal stenosis at L4-5. 6. Mild to moderate bilateral foraminal stenosis at L5-S1. Electronically Signed   By: Rudie Meyer M.D.   On: 08/02/2019 10:39    Procedures Procedures (including critical care time)  Medications Ordered in ED Medications  fentaNYL (SUBLIMAZE) injection 50 mcg (has no administration in time range)  diltiazem (CARDIZEM) injection 10 mg (10 mg Intravenous Given 08/02/19 0820)  acetaminophen (TYLENOL) tablet 650 mg (650 mg Oral Given 08/02/19 0818)  diazepam (VALIUM) tablet 2 mg (2 mg Oral Given 08/02/19 0818)  sodium chloride 0.9 % bolus 500 mL (0 mLs Intravenous Stopped 08/02/19 1049)  diltiazem (CARDIZEM) tablet 120 mg (120 mg Oral Given 08/02/19 0929)     Initial Impression / Assessment and Plan / ED Course  I have reviewed the triage vital signs and the nursing notes.  Pertinent labs & imaging results that were available during my care of the patient were reviewed by me and considered in my medical decision making (see chart for details).     Todd Bright is a 78 year old male with history of back pain, A. fib who presents the ED with back pain.  Patient states that he was going up a flight of stairs and when he got to the top he lost balance with his walker and slid down the wall landing on his buttocks.  Did not hit his head did not lose consciousness.  States that his back locked up on him.  Has a history of back spasms.  States that he has some difficulty getting up.  Denies any loss of bowel or bladder.  No numbness or tingling  in the legs.  Pain is mostly in  the paraspinal muscles of the lumbar spine.  No midline spinal tenderness.  Has some skin tears to his elbows from trying to get up off the floor.  Doubt any cord compression.  Doubt any intracranial injury.  Is not on blood thinners.  Appears to have rapid heart rate but did not take his diltiazem this morning.  Otherwise vitals are unremarkable.  Patient denies any shortness of breath, chest pain.  We will get basic labs, give IV diltiazem, saline bolus, lumbar x-ray, give Valium and Tylenol for pain and spasm.  Patient has recent hip fracture and is working with physical therapy at home.  He did have atrial fibrillation at the time of admission about a month ago.  Is supposed to be on blood thinners but does not take them and has not follow-up with his primary care doctor or cardiology to discuss his treatment.  Lab work overall unremarkable.  No significant anemia, electrolyte abnormality, kidney injury.  X-ray of the low back shows severe wedging of the L2 vertebral body which appears acute.  Possibly some other subacute fractures.  Patient however does not have any specific midline tenderness on exam.  Neurologically he is intact.  Will further evaluate with a CT scan.  Patient feels much improved after Valium and Tylenol.  States that muscle spasms have almost gone away.  Heart rate after IV diltiazem has improved to the low 100s.  Will give his home dose of diltiazem orally and continue to evaluate.  Awaiting CT scan.  CT scan confirmed L2 compression fracture.  Will order TLSO brace.  Had some more low back pain and will order IV fentanyl.  Overall given recent hip fracture and now compression fracture think patient would benefit from inpatient stay for pain control physical therapy.  I have paged neurosurgery to make them aware of the patient.  Awaiting callback.  Will talk with hospitalist about admission.  Heart rate has improved now to the 80s.  This chart was dictated using voice recognition  software.  Despite best efforts to proofread,  errors can occur which can change the documentation meaning.    Final Clinical Impressions(s) / ED Diagnoses   Final diagnoses:  Closed fracture of second lumbar vertebra, unspecified fracture morphology, initial encounter [facility])    ED Discharge Orders    None       Virgina Norfolk, DO 08/02/19 1232

## 2019-08-02 NOTE — H&P (Addendum)
Family Medicine Teaching [facility] Admission History and Physical Service Pager: [phone]  Patient name: Todd Bright Medical record number: [id number] Date of birth: 12/03/1940 Age: 78 y.o. Gender: male  Primary Care Provider: Joette Catching, MD Consultants: Neurosurgery   Code Status: DNR (confirmed on admission) Preferred Emergency Contact: Todd Bright (1st cousin, caretaker) [phone] (home number)  Chief Complaint: Back pain from fall  Assessment and Plan: Todd Bright is a 78 y.o. male presenting with back pain from recent fall . PMH is significant for HTN, CKD, A.Fib with Cardioversion  Back pain 2/2 fall likely related to decreased visual stimulation and power outage. Patient states he was using a walker going up the stairs and states that it was dark due to power outage.  He is unsure how he fell.  He denies any dizziness, LOC prior to fall.  Reports sliding down a number of stairs scraping bilateral forearms and landing on backside. Did not hit head. X-ray lumbar spine significant for severe wedging of L2 vertebral body appears to be acute with bony fragment anterior to the L2 vertebral body.  Nondisplaced fracture at the mid to posterior aspect of L3 vertebral body along with a probable old wedge fracture T12.  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  On exam, neurologically intact with 5/5 LE strength. No pain at time of exam but notably received fentanyl in ED. Imbalance likely stemming from decreased visual acuity given power outage with likely contribution from limited mobility from recent hip surgery. Also consider medications as potential cause of decreased awareness as patient taking lorazepam 0.5 mg 3 times daily as needed, bottle empty at bedside despite PMP aware with end date 11/12.  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 Neurosurgery consulted in ED who did not recommend surgical intervention but mobilization in TLSO brace with PT/OT and follow up outpatient. -Admit to MedSurg, Dr McDiarmid -pulse ox monitoring -PT/OT evaluation -vital signs per unit -Oxycodone IR 5 mg every 4 hours as needed -Acetaminophen 650 mg as needed - follow up outpt w/ Neurosurgery - mobilize in TLSO brace, at bedside - wound consult for skin tears - consider calcitonin given fracture  Paroxysmal Afib w/ RVR (resolved)   In afib on exam. No complaints of chest pain.  Initial ECG shows A. fib with QTC prolongation 514.  He was given home Cardizem 120mg  in the ED with successful rate control.  Home medication Cardizem 120 mg daily. Not currently on anticoagulation. Per chart review, was previously on xarelto but has refused to take it as well as follow up with Cardiology. CHA2DS2VASc 3, HAS BLED 2 not indicating major risk of bleeding. -Continue diltiazem 120 mg daily.  -EKG in a.m. -follow up anticoagulation  Anxiety Medication Lexapro and lorazepam 0.5 mg. -Continue home medication  Right hip fracture SP intramedullary hip pain 06/06/19 Sustained after fall 06/2019. Mobilizes with walker. Working with PT outpatient. Had received lovenox, finished course. -Gabapentin 100 mg qhs  Constipation MiraLAX and Senokot at home. -Continue home medication  Hyperlipidemia Home medication simvastatin 10 mg -Continue home meds  H/o CKD3 Per chart review, baseline appears to be around 1.1-1.3, however Cr 0.84 on admission.  FEN/GI:  Regular diet   Prophylaxis:  Lovenox  Disposition: Admit to Med Surg, Attending Dr. McDiarmid  History of Present Illness:  Todd Bright is  a 78 y.o. male presenting with L2 compression fracture s/p fall. Patient reports he was at home yesterday evening when he got the sensation to have a bowel movement. He then walked up the stairs in the dark  with his walker when he lost his balance at the top of the stairs and fell down on his bottom. He did not hit his head. He was down for about 2 hours before his daughter came home from work and found him and called 911. In the ED was reported to have severe low back pain however currently denies pain.   He lives at home with his wife and daughter. He ambulates with a walker ever since his ORIF of R hip fracture 06/2019. His main caretaker is his 1st cousin, Todd Bright, who also manages his medications. He did not take his home medications this morning.  In the ED he was in Afib RVR HR 120-130's, BP 150-160/133-134, RR 15-18 with oxygen saturations 90-100% on room air.  He was given IV Cardizem 10 mg without resolution and then subsequently received home 120 mg diltiazem with HR returning to 80's.  For pain he was given IV Fentanyl 50 mcg, Tylenol and Diazepam 2 mg po x1.  X-ray L-spine and CT lumbar spine completed with evidence of acute L2 compression fracture without canal compromise.  Review Of Systems: Per HPI with the following additions:   Review of Systems  Constitutional: Negative for fever.  Eyes: Negative for blurred vision and double vision.  Cardiovascular: Negative for chest pain.  Gastrointestinal: Positive for constipation.  Musculoskeletal: Positive for back pain and falls.  Neurological: Negative for dizziness, tingling, weakness and headaches.    Patient Active Problem List   Diagnosis Date Noted  . Compression fracture of body of thoracic vertebra (HCC) 08/02/2019  . Closed fracture of second lumbar vertebra (HCC)   . Fall   . Closed intertrochanteric fracture of hip, right, initial encounter (HCC) 06/05/2019  . Atrial fibrillation, chronic (HCC) 06/05/2019  . Essential hypertension 06/05/2019  . CKD (chronic kidney disease), stage III 06/05/2019  . AKI (acute kidney injury) (HCC) 06/05/2019    Past Medical History: Past Medical History:  Diagnosis Date  .  Arrhythmia   . Atrial fibrillation (HCC)   . Cataracts, both eyes   . CKD (chronic kidney disease), stage III   . Hypertension     Past Surgical History: Past Surgical History:  Procedure Laterality Date  . CARDIOVERSION    . INTRAMEDULLARY (IM) NAIL INTERTROCHANTERIC Right 06/06/2019   Procedure: INTRAMEDULLARY (IM) NAIL INTERTROCHANTRIC;  Surgeon: Sheral Apley, MD;  Location: [facility] OR;  Service: Orthopedics;  Laterality: Right;    Social History: Social History   Tobacco Use  . Smoking status: Former Smoker    Quit date: 05/21/2015    Years since quitting: 4.2  . Smokeless tobacco: Never Used  Substance Use Topics  . Alcohol use: No  . Drug use: No   Additional social history: denies current alcohol, tobacco use. Please also refer to relevant sections of EMR.  Family History: Family History  Problem Relation Age of Onset  . Heart attack Mother   . Emphysema Father   . Cancer Sister     Allergies and Medications: No Known Allergies No current facility-administered medications on file prior to encounter.    Current Outpatient Medications on File Prior to Encounter  Medication Sig Dispense Refill  . acetaminophen (TYLENOL) 325 MG tablet Take 325-650 mg by mouth every 6 (six) hours as  needed for mild pain or headache.    . diltiazem (CARDIZEM) 120 MG tablet Take 120 mg by mouth daily.    Marland Kitchen escitalopram (LEXAPRO) 10 MG tablet Take 10 mg by mouth daily.    Marland Kitchen gabapentin (NEURONTIN) 100 MG capsule Take 100 mg by mouth at bedtime.    Marland Kitchen LORazepam (ATIVAN) 0.5 MG tablet Take 0.5 mg by mouth 3 (three) times daily.    . Melatonin 3 MG TABS Take 3 mg by mouth at bedtime.    . Multiple Vitamin (MULTIVITAMIN WITH MINERALS) TABS tablet Take 1 tablet by mouth daily.    . Omega-3 Fatty Acids (FISH OIL) 1000 MG CPDR Take 1,000 mg by mouth daily.    . polyethylene glycol (MIRALAX / GLYCOLAX) 17 g packet Take 17 g by mouth daily as needed for mild constipation. 14 each 0  .  senna-docusate (SENOKOT-S) 8.6-50 MG tablet Take 2 tablets by mouth at bedtime as needed for mild constipation or moderate constipation.    . simvastatin (ZOCOR) 10 MG tablet TAKE 1 TABLET (10 MG TOTAL) DAILY BY MOUTH. PT NEEDS APPT FOR REFILLS (Patient taking differently: Take 10 mg by mouth every evening. ) 30 tablet 0    Objective: BP (!) 127/98 (BP Location: Right Arm)   Pulse 83   Temp 98 F (36.7 C) (Oral)   Resp 16   Ht 6' (1.829 m)   Wt 77.1 kg   SpO2 99%   BMI 23.06 kg/m  Exam: General: 78 year old gentleman in no acute distress. Cardiovascular: Irregular rate and rhythm, no murmurs appreciated Respiratory: Chest clear to auscultation bilaterally, no crackles no rhonchi, no increased work of breathing Gastrointestinal: Soft, nontender, nondistended, bowel sounds present, no organomegaly MSK: no pain with pelvic compression, no tenderness on palpation of vertebral spine.  Moving all extremities, no lower extremity edema, 5/5 LE strength bilaterally Derm: Bilateral elbow and forearm shear injuries  Neuro: Alert and orientated to person, place, patient difficulty remembering time.  Normal sensation.  Good strength.  Gait unassessed. Psych: Anxious but pleasant gentleman.  Answers questions appropriately and cooperative.  Labs and Imaging: CBC BMET  Recent Labs  Lab 08/02/19 0814  WBC 8.5  HGB 15.9  HCT 48.1  PLT 276   Recent Labs  Lab 08/02/19 0814  NA 140  K 4.0  CL 102  CO2 26  BUN 10  CREATININE 0.84  GLUCOSE 103*  CALCIUM 8.9     EKG:  Atrial fibrillation Borderline low voltage, extremity leads Abnormal R-wave progression, early transition Prolonged QT interval Confirmed by Lennice Sites [phone]) on 08/02/2019 8:06:17 AM  Dg Lumbar Spine Complete  Result Date: 08/02/2019 CLINICAL DATA:  Pain following fall EXAM: LUMBAR SPINE - COMPLETE 4+ VIEW COMPARISON:  None FINDINGS: Frontal, lateral, spot lumbosacral lateral, and bilateral oblique views were  obtained. There is an acute appearing fracture of the L2 vertebral body with marked wedging at this level. A bony fragment is noted anterior to the L2 vertebral body. There is a lucency extending through a portion of the L3 vertebral body concerning for nondisplaced fracture in this area. There is a probable old wedge fracture at T12. There is 6 mm of anterolisthesis of L5 on S1. No other spondylolisthesis. There is moderately severe disc space narrowing at L5-S1. There is mild disc space narrowing at L2-3. There is facet osteoarthritic change to varying degrees at all levels, most notably at L4-5 and L5-S1 bilaterally. There is aortic atherosclerosis. IMPRESSION: Severe wedging of the L2 vertebral body  which appears acute with a bony fragment anterior to the L2 vertebral body. Nondisplaced fracture apparent in the mid to posterior aspect of the L3 vertebral body. Probable old wedge fracture at T12. 6 mm of anterolisthesis of L5 on S1 which is likely due to underlying spondylosis. Severe disc space narrowing noted at L5-S1. Multifocal facet arthropathy noted. Aortic Atherosclerosis (ICD10-I70.0). Electronically Signed   By: Bretta BangWilliam  Woodruff III M.D.   On: 08/02/2019 09:10   Ct Lumbar Spine Wo Contrast  Result Date: 08/02/2019 CLINICAL DATA:  Larey SeatFell.  Injured back. EXAM: CT LUMBAR SPINE WITHOUT CONTRAST TECHNIQUE: Multidetector CT imaging of the lumbar spine was performed without intravenous contrast administration. Multiplanar CT image reconstructions were also generated. COMPARISON:  Lumbar radiographs 08/02/2019 FINDINGS: Segmentation: There are five lumbar type vertebral bodies. The last full intervertebral disc space is labeled L5-S1. Alignment: Bilateral pars defects at L5 with a grade 1 spondylolisthesis. There is also mild degenerative anterolisthesis of L4. The other vertebral bodies are normally aligned. Vertebrae: There is a remote appearing compression fracture of T12. No retropulsion or canal  compromise. Acute compression fracture of L2 with approximately 60% central compression of the superior endplate. There is mild retropulsion of the posterosuperior aspect of vertebral body but no significant canal stenosis. No involvement of the pedicles or posterior elements. The other lumbar vertebral bodies are maintained. No acute fracture. There is moderate to advanced osteoporosis noted. Multilevel facet disease. Paraspinal and other soft tissues: No significant paraspinal or retroperitoneal findings. Small amount of paraspinal hematoma at L2. Disc levels: T12-L1: No significant findings. L1-2: Mild retropulsion involving the posterosuperior aspect of L2 with mild flattening of the ventral thecal sac but no significant canal stenosis. No significant foraminal stenosis. L2-3: Mild diffuse annular bulge with flattening of the ventral thecal sac but no large disc protrusion, spinal or significant foraminal stenosis. L3-4: Bulging annulus and facet disease but no significant spinal or foraminal stenosis. L4-5: Postoperative changes with a right-sided laminectomy defect. There is a bulging uncovered disc, facet disease and ligamentum flavum thickening contributing to mild spinal and bilateral lateral recess stenosis. There is also mild bilateral foraminal stenosis. L5-S1: Bulging uncovered disc but the canal is generous and there is no significant spinal stenosis. Severe facet disease. Mild to moderate bilateral foraminal stenosis. IMPRESSION: 1. Acute L2 compression fracture with mild retropulsion but no significant canal compromise. 2. Remote T12 compression fracture. 3. Osteoporosis. 4. 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 5. Mild multifactorial spinal and bilateral lateral recess stenosis and bilateral foraminal stenosis at L4-5. 6. Mild to moderate bilateral foraminal stenosis at L5-S1. Electronically Signed   By: Rudie MeyerP.   Gallerani M.D.   On: 08/02/2019 10:39    Dana AllanWalsh, Tanya, MD 08/02/2019, 6:18 PM PGY-1, [facility] FPTS Intern pager: [phone], text pages welcome  FPTS Upper-Level Resident Addendum   I have independently interviewed and examined the patient. I have discussed the above with the original author and agree with their documentation. My edits for correction/addition/clarification are in green. Please see also any attending notes.    Ellwood DenseAlison Greg Cratty, DO PGY-3, [place] [facility] 08/02/2019 7:15 PM  FPTS Service pager: [phone] (text pages welcome through [facility])

## 2019-08-02 NOTE — ED Notes (Signed)
Patient transported to X-ray 

## 2019-08-03 ENCOUNTER — Encounter (HOSPITAL_COMMUNITY): Payer: Self-pay | Admitting: Family Medicine

## 2019-08-03 ENCOUNTER — Inpatient Hospital Stay (HOSPITAL_COMMUNITY): Payer: Medicare Other

## 2019-08-03 DIAGNOSIS — S72141A Displaced intertrochanteric fracture of right femur, initial encounter for closed fracture: Secondary | ICD-10-CM | POA: Diagnosis not present

## 2019-08-03 DIAGNOSIS — M8000XD Age-related osteoporosis with current pathological fracture, unspecified site, subsequent encounter for fracture with routine healing: Secondary | ICD-10-CM | POA: Diagnosis not present

## 2019-08-03 DIAGNOSIS — M810 Age-related osteoporosis without current pathological fracture: Secondary | ICD-10-CM | POA: Diagnosis present

## 2019-08-03 DIAGNOSIS — S32029A Unspecified fracture of second lumbar vertebra, initial encounter for closed fracture: Secondary | ICD-10-CM | POA: Diagnosis not present

## 2019-08-03 DIAGNOSIS — I482 Chronic atrial fibrillation, unspecified: Secondary | ICD-10-CM | POA: Diagnosis not present

## 2019-08-03 LAB — CBC
HCT: 41.7 % (ref 39.0–52.0)
Hemoglobin: 14.2 g/dL (ref 13.0–17.0)
MCH: 32.4 pg (ref 26.0–34.0)
MCHC: 34.1 g/dL (ref 30.0–36.0)
MCV: 95.2 fL (ref 80.0–100.0)
Platelets: 283 10*3/uL (ref 150–400)
RBC: 4.38 MIL/uL (ref 4.22–5.81)
RDW: 13 % (ref 11.5–15.5)
WBC: 7 10*3/uL (ref 4.0–10.5)
nRBC: 0 % (ref 0.0–0.2)

## 2019-08-03 LAB — BASIC METABOLIC PANEL
Anion gap: 12 (ref 5–15)
BUN: 12 mg/dL (ref 8–23)
CO2: 25 mmol/L (ref 22–32)
Calcium: 8.7 mg/dL — ABNORMAL LOW (ref 8.9–10.3)
Chloride: 99 mmol/L (ref 98–111)
Creatinine, Ser: 0.89 mg/dL (ref 0.61–1.24)
GFR calc Af Amer: >60 mL/min (ref >60)
GFR calc non Af Amer: >60 mL/min (ref >60)
Glucose, Bld: 79 mg/dL (ref 70–99)
Potassium: 3.6 mmol/L (ref 3.5–5.1)
Sodium: 136 mmol/L (ref 135–145)

## 2019-08-03 LAB — PROTIME-INR
INR: 1.1 (ref 0.8–1.2)
Prothrombin Time: 13.6 seconds (ref 11.4–15.2)

## 2019-08-03 NOTE — Plan of Care (Signed)
  Problem: Education: Goal: Knowledge of General Education information will improve Description: Including pain rating scale, medication(s)/side effects and non-pharmacologic comfort measures Outcome: Progressing   Problem: Activity: Goal: Risk for activity intolerance will decrease Outcome: Progressing   

## 2019-08-03 NOTE — Evaluation (Signed)
Physical Therapy Evaluation Patient Details Name: Todd Bright MRN: [id number] DOB: 06-Dec-1940 Today's Date: 08/03/2019   History of Present Illness  78 y.o male who presented from home after falling- found to have L2 compression fx. PMH includes falls, arrythmia, chronic A-fib, chronic kidney disease and hypertension.  Clinical Impression  Patient received in bed, agrees to PT evaluation. Patient reports pain in low back. Very talkative, difficult to keep on task. Requires min assist with supine to sit to raise trunk. Performs sit to stand with min guard. Ambulated 20 feet with RW and min guard assist. Patient required assistance donning lumbar corset in seated position. Unsteady with ambulation and fearful of falling. Patient will continue to benefit from skilled PT to improve strength, balance and safety with mobility.     Follow Up Recommendations Home health PT    Equipment Recommendations  None recommended by PT    Recommendations for Other Services       Precautions / Restrictions Precautions Precautions: Fall Required Braces or Orthoses: Spinal Brace Spinal Brace: Lumbar corset;Applied in sitting position Restrictions Weight Bearing Restrictions: No      Mobility  Bed Mobility Overal bed mobility: Needs Assistance Bed Mobility: Supine to Sit     Supine to sit: Min assist     General bed mobility comments: min assist to raise trunk to sitting position  Transfers Overall transfer level: Needs assistance Equipment used: Rolling walker (2 wheeled) Transfers: Sit to/from Stand Sit to Stand: Min guard            Ambulation/Gait Ambulation/Gait assistance: Min guard Gait Distance (Feet): 20 Feet Assistive device: Rolling walker (2 wheeled) Gait Pattern/deviations: Step-to pattern;Decreased stride length;Narrow base of support Gait velocity: decreased   General Gait Details: shaky with mobility, fearful of falling.  Stairs            Wheelchair  Mobility    Modified Rankin (Stroke Patients Only)       Balance Overall balance assessment: Needs assistance Sitting-balance support: Feet supported Sitting balance-Leahy Scale: Fair     Standing balance support: Bilateral upper extremity supported;During functional activity Standing balance-Leahy Scale: Fair Standing balance comment: unsteady with mobility, reliant on RW for stability and min guard.                             Pertinent Vitals/Pain Pain Assessment: Faces Faces Pain Scale: Hurts a little bit Pain Location: back Pain Descriptors / Indicators: Aching;Sore Pain Intervention(s): Limited activity within patient's tolerance;Monitored during session;Repositioned    Home Living Family/patient expects to be discharged to:: Private residence Living Arrangements: Spouse/significant other;Children Available Help at Discharge: Family;Available PRN/intermittently Type of Home: House Home Access: Stairs to enter   Entrance Stairs-Number of Steps: 1 Home Layout: Two level;Able to live on main level with bedroom/bathroom Home Equipment: Walker - 2 wheels      Prior Function Level of Independence: Independent with assistive device(s)         Comments: patient's cousin, Vanduyne helps with meds, assists with other needs- comes by daily.     Hand Dominance   Dominant Hand: Right    Extremity/Trunk Assessment   Upper Extremity Assessment Upper Extremity Assessment: Overall WFL for tasks assessed    Lower Extremity Assessment Lower Extremity Assessment: Generalized weakness    Cervical / Trunk Assessment Cervical / Trunk Assessment: Normal  Communication   Communication: No difficulties  Cognition Arousal/Alertness: Awake/alert Behavior During Therapy: WFL for tasks assessed/performed Overall  Cognitive Status: Within Functional Limits for tasks assessed                                 General Comments: talkative, difficult to  keep on track at times      General Comments      Exercises     Assessment/Plan    PT Assessment Patient needs continued PT services  PT Problem List Decreased strength;Decreased mobility;Decreased activity tolerance;Decreased balance;Pain;Decreased cognition       PT Treatment Interventions Therapeutic activities;Gait training;Therapeutic exercise;Patient/family education;Functional mobility training;Balance training    PT Goals (Current goals can be found in the Care Plan section)  Acute Rehab PT Goals Patient Stated Goal: none stated    Frequency Min 3X/week   Barriers to discharge Decreased caregiver support      Co-evaluation               AM-PAC PT "6 Clicks" Mobility  Outcome Measure Help needed turning from your back to your side while in a flat bed without using bedrails?: A Little Help needed moving from lying on your back to sitting on the side of a flat bed without using bedrails?: A Little Help needed moving to and from a bed to a chair (including a wheelchair)?: A Little Help needed standing up from a chair using your arms (e.g., wheelchair or bedside chair)?: A Little Help needed to walk in hospital room?: A Little Help needed climbing 3-5 steps with a railing? : A Little 6 Click Score: 18    End of Session Equipment Utilized During Treatment: Gait belt;Back brace Activity Tolerance: Patient tolerated treatment well Patient left: in chair;with call bell/phone within reach Nurse Communication: Mobility status PT Visit Diagnosis: Muscle weakness (generalized) (M62.81);Difficulty in walking, not elsewhere classified (R26.2);Repeated falls (R29.6);Pain Pain - part of body: (back)    Time: 2951-8841 PT Time Calculation (min) (ACUTE ONLY): 28 min   Charges:   PT Evaluation $PT Eval Moderate Complexity: 1 Mod PT Treatments $Gait Training: 8-22 mins        Konner Saiz, PT, GCS 08/03/19,1:38 PM

## 2019-08-03 NOTE — Progress Notes (Signed)
Family Medicine Teaching Service Daily Progress Note Intern Pager: [phone]  Patient name: Todd Bright Medical record number: [id number] Date of birth: 1941/09/02 Age: 78 y.o. Gender: male  Primary Care Provider: Dione Housekeeper, Bright Bright: Neurosurgery Code Status:   Code Status: DNR Preferred Emergency Contact: Todd Bright (1st cousin, caretaker) [phone] (home number)  Pt Overview and Major Events to Date:  Todd Bright is a 78 y.o. male presenting with lumbar fracture from recent fall. PMH is significant for HTN, CKD, A.Fib with Cardioversion  Assessment and Plan:   Acute L2 vertebral fracture s/p fall Patient presenting with L2 vertebral fracture status post fall.  Has no neurologic deficits on exam.  Likely has osteoporosis given multiple fractures including chronic T12 fracture seen on imaging and recent fractures including right hip hip fracture status post surgical fixation with intramedullary nail in 06/2019.  Will likely need a bisphosphonate after fracture healing is occurred.  Patient evaluated in ED by neurosurgery.do not recommend surgery at this time.  They recommended outpatient follow-up, TLSO brace, but mobilization, with PT OT evaluation.  PT OT eval the currently pending.  Patient complaining of mild pain in his lower back, did not take any as needed's overnight..  Patient take as needed this morning.  Otherwise doing well. -pulse ox monitoring -PT/OT evaluation -vital signs per unit -Oxycodone IR 5 mg every 4 hours as needed -Acetaminophen 650 mg as needed - follow up outpt w/ Neurosurgery - mobilize in TLSO brace, at bedside - wound consult for skin tears - consider calcitonin for pain control given fracture  Paroxysmal Afib  Initially presented with atrial fibrillation with RVR, now currently rate controlled.  Home medication Cardizem 120 mg daily. Not currently on anticoagulation. Per chart review, was previously on xarelto but has refused to take it  as well as follow up with Cardiology. CHA2DS2VASc 3, HAS BLED 2 not indicating major risk of bleeding. -Continue diltiazem 120 mg daily.   Anxiety Medication Lexapro and lorazepam 0.5 mg. -Continue home medication  Right hip fracture SP intramedullary hip pain 06/06/19 Sustained after fall 06/2019. Mobilizes with walker. Working with PT outpatient. Had received lovenox, finished course. -Gabapentin 100 mg qhs  Constipation MiraLAX and Senokot at home. -Continue home medication  Hyperlipidemia Home medication simvastatin 10 mg -Continue home meds  H/o CKD3 Per chart review, baseline appears to be around 1.1-1.3, however Cr 0.84 on admission.  FEN/GI: Regular diet   Prophylaxis: Lovenox  Disposition:  Pending PT/OT evaluation  Subjective:  Patient reports that he is doing okay, some mild back discomfort.  Says it feels "tight".  Thinks that this is due to him being elevated the bed.  Did reclining in the bed and said that he felt better but, he would like something for pain.  Encourage patient to take one he has as needed pain medications and reevaluate after this.  Objective: Temp:  [97.9 F (36.6 C)] 97.9 F (36.6 C) (10/30 2039) Pulse Rate:  [41-117] 41 (10/31 0449) Resp:  [12-18] 18 (10/31 0435) BP: (127-156)/(83-116) 135/100 (10/31 0449) SpO2:  [92 %-100 %] 100 % (10/31 0449) Physical Exam: General: NAD, well appearing CVS: RRR, no MRG Lungs: CTAB, no increased work of breathing Abdomen: Soft, nontender, nondistended MSK: No lower extremity edema, able to move lower extremities without issue, 5 of 5 strength, normal sensation intact.   Laboratory: Recent Labs  Lab 08/02/19 0814 08/03/19 0432  WBC 8.5 7.0  HGB 15.9 14.2  HCT 48.1 41.7  PLT 276 283  Recent Labs  Lab 08/02/19 0814 08/03/19 0432  NA 140 136  K 4.0 3.6  CL 102 99  CO2 26 25  BUN 10 12  CREATININE 0.84 0.89  CALCIUM 8.9 8.7*  GLUCOSE 103* 79      Imaging/Diagnostic  Tests: Dg Lumbar Spine Complete  Result Date: 08/02/2019 CLINICAL DATA:  Pain following fall EXAM: LUMBAR SPINE - COMPLETE 4+ VIEW COMPARISON:  None FINDINGS: Frontal, lateral, spot lumbosacral lateral, and bilateral oblique views were obtained. There is an acute appearing fracture of the L2 vertebral body with marked wedging at this level. A bony fragment is noted anterior to the L2 vertebral body. There is a lucency extending through a portion of the L3 vertebral body concerning for nondisplaced fracture in this area. There is a probable old wedge fracture at T12. There is 6 mm of anterolisthesis of L5 on S1. No other spondylolisthesis. There is moderately severe disc space narrowing at L5-S1. There is mild disc space narrowing at L2-3. There is facet osteoarthritic change to varying degrees at all levels, most notably at L4-5 and L5-S1 bilaterally. There is aortic atherosclerosis. IMPRESSION: Severe wedging of the L2 vertebral body which appears acute with a bony fragment anterior to the L2 vertebral body. Nondisplaced fracture apparent in the mid to posterior aspect of the L3 vertebral body. Probable old wedge fracture at T12. 6 mm of anterolisthesis of L5 on S1 which is likely due to underlying spondylosis. Severe disc space narrowing noted at L5-S1. Multifocal facet arthropathy noted. Aortic Atherosclerosis (ICD10-I70.0). Electronically Signed   By: Bretta Bang III M.D.   On: 08/02/2019 09:10   Ct Lumbar Spine Wo Contrast  Result Date: 08/02/2019 CLINICAL DATA:  Larey Seat.  Injured back. EXAM: CT LUMBAR SPINE WITHOUT CONTRAST TECHNIQUE: Multidetector CT imaging of the lumbar spine was performed without intravenous contrast administration. Multiplanar CT image reconstructions were also generated. COMPARISON:  Lumbar radiographs 08/02/2019 FINDINGS: Segmentation: There are five lumbar type vertebral bodies. The last full intervertebral disc space is labeled L5-S1. Alignment: Bilateral pars defects at  L5 with a grade 1 spondylolisthesis. There is also mild degenerative anterolisthesis of L4. The other vertebral bodies are normally aligned. Vertebrae: There is a remote appearing compression fracture of T12. No retropulsion or canal compromise. Acute compression fracture of L2 with approximately 60% central compression of the superior endplate. There is mild retropulsion of the posterosuperior aspect of vertebral body but no significant canal stenosis. No involvement of the pedicles or posterior elements. The other lumbar vertebral bodies are maintained. No acute fracture. There is moderate to advanced osteoporosis noted. Multilevel facet disease. Paraspinal and other soft tissues: No significant paraspinal or retroperitoneal findings. Small amount of paraspinal hematoma at L2. Disc levels: T12-L1: No significant findings. L1-2: Mild retropulsion involving the posterosuperior aspect of L2 with mild flattening of the ventral thecal sac but no significant canal stenosis. No significant foraminal stenosis. L2-3: Mild diffuse annular bulge with flattening of the ventral thecal sac but no large disc protrusion, spinal or significant foraminal stenosis. L3-4: Bulging annulus and facet disease but no significant spinal or foraminal stenosis. L4-5: Postoperative changes with a right-sided laminectomy defect. There is a bulging uncovered disc, facet disease and ligamentum flavum thickening contributing to mild spinal and bilateral lateral recess stenosis. There is also mild bilateral foraminal stenosis. L5-S1: Bulging uncovered disc but the canal is generous and there is no significant spinal stenosis. Severe facet disease. Mild to moderate bilateral foraminal stenosis. IMPRESSION: 1. Acute L2 compression fracture with mild  retropulsion but no significant canal compromise. 2. Remote T12 compression fracture. 3. Osteoporosis. 4. 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 5. Mild multifactorial spinal and bilateral lateral recess stenosis and bilateral foraminal stenosis at L4-5. 6. Mild to moderate bilateral foraminal stenosis at L5-S1. Electronically Signed   By: Rudie MeyerP.  Gallerani M.D.   On: 08/02/2019 10:39    Garnette Gunnerhompson, Aaron B, Bright 08/03/2019, 8:09 AM PGY-3, [facility] FPTS Intern pager: [phone], text pages welcome

## 2019-08-04 ENCOUNTER — Inpatient Hospital Stay (HOSPITAL_COMMUNITY): Payer: Medicare Other

## 2019-08-04 LAB — BASIC METABOLIC PANEL
Anion gap: 11 (ref 5–15)
BUN: 12 mg/dL (ref 8–23)
CO2: 25 mmol/L (ref 22–32)
Calcium: 8.7 mg/dL — ABNORMAL LOW (ref 8.9–10.3)
Chloride: 99 mmol/L (ref 98–111)
Creatinine, Ser: 1.07 mg/dL (ref 0.61–1.24)
GFR calc Af Amer: >60 mL/min (ref >60)
GFR calc non Af Amer: >60 mL/min (ref >60)
Glucose, Bld: 92 mg/dL (ref 70–99)
Potassium: 3.8 mmol/L (ref 3.5–5.1)
Sodium: 135 mmol/L (ref 135–145)

## 2019-08-04 LAB — CBC
HCT: 43.2 % (ref 39.0–52.0)
Hemoglobin: 14.6 g/dL (ref 13.0–17.0)
MCH: 32 pg (ref 26.0–34.0)
MCHC: 33.8 g/dL (ref 30.0–36.0)
MCV: 94.7 fL (ref 80.0–100.0)
Platelets: 318 10*3/uL (ref 150–400)
RBC: 4.56 MIL/uL (ref 4.22–5.81)
RDW: 12.9 % (ref 11.5–15.5)
WBC: 7.9 10*3/uL (ref 4.0–10.5)
nRBC: 0 % (ref 0.0–0.2)

## 2019-08-04 MED ORDER — SENNOSIDES-DOCUSATE SODIUM 8.6-50 MG PO TABS
2.0000 | ORAL_TABLET | Freq: Every day | ORAL | Status: DC
  Administered 2019-08-04 – 2019-08-05 (×2): 2 via ORAL
  Filled 2019-08-04 (×2): qty 2

## 2019-08-04 NOTE — Progress Notes (Signed)
NEUROSURGERY PROGRESS NOTE  Doing well. Denies any back pain. Has only had one episode of mild back spasms. Has not gotten out of bed yet  Temp:  [98.1 F (36.7 C)-98.2 F (36.8 C)] 98.1 F (36.7 C) (11/01 0601) Pulse Rate:  [80-108] 108 (11/01 0601) Resp:  [18] 18 (11/01 0601) BP: (104-153)/(80-100) 153/100 (11/01 0601) SpO2:  [98 %-100 %] 99 % (11/01 0601)  Plan: Please place him in lumbar corset brace and ambulate out of bed today.   Eleonore Chiquito, NP 08/04/2019 8:26 AM

## 2019-08-04 NOTE — Progress Notes (Signed)
Patient's peripheral IV came off, currently not receiving any IV meds or fluids.  MD made aware and was okay for now to not have peripheral IV.

## 2019-08-04 NOTE — Progress Notes (Signed)
Patient not get any IV medications scheduled or prn. Educated nurse about vein preservation and not placing IVs "just in case." Please re-consult IV team if IV access is needed in the future.

## 2019-08-04 NOTE — Evaluation (Signed)
Occupational Therapy Evaluation Patient Details Name: Todd Bright MRN: [id number] DOB: 24-Mar-1941 Today's Date: 08/04/2019    History of Present Illness 78 y.o male who presented from home after falling- found to have L2 compression fx. PMH includes falls, arrythmia, chronic A-fib, chronic kidney disease and hypertension.   Clinical Impression   PTA patient reports independent using RW for ADLs.  Admitted for above and limited by problem list below, including impaired cognition, decreased activity tolerance, generalized weakness, back pain and precautions, and impaired balance.  Cognitively, patient requires increased time for processing and problem solving given min assist,  Short blessed test reveals poor attention, memory, sequencing and problem solving (scoring 18/28--impairment consistent with dementia) and further assessment is needed.  Patient educated on safety, back precautions, brace mgmt and recommendations.  Patient currently requires min guard for transfers, min assist for LB ADLs and min guard for in room mobility.  Patient will benefit from continued OT services while admitted and after dc at [facility] level IF PATIENT HAS 24/7 SUPPORT, in order to maximize independence and safety with ADLs,mobiltiy.  Pt currently reports he does not have 24/7 support, and if unable to arrange will need SNF.     Follow Up Recommendations  Home health OT;Supervision/Assistance - 24 hour(if unable to have 24/7 support will need SNF )    Equipment Recommendations  None recommended by OT    Recommendations for Other Services       Precautions / Restrictions Precautions Precautions: Fall;Back Precaution Booklet Issued: No Precaution Comments: reviewed back precautions  Required Braces or Orthoses: Spinal Brace Spinal Brace: Lumbar corset;Applied in sitting position Restrictions Weight Bearing Restrictions: No      Mobility Bed Mobility Overal bed mobility: Needs Assistance Bed Mobility:  Supine to Sit;Sit to Supine     Supine to sit: Min guard Sit to supine: Min guard   General bed mobility comments: min guard for log roll technique and guarding  Transfers Overall transfer level: Needs assistance Equipment used: Rolling walker (2 wheeled) Transfers: Sit to/from Stand Sit to Stand: Min guard         General transfer comment: min guard for safety/balance     Balance Overall balance assessment: Needs assistance Sitting-balance support: Feet supported Sitting balance-Leahy Scale: Fair     Standing balance support: Bilateral upper extremity supported;During functional activity Standing balance-Leahy Scale: Poor Standing balance comment: relaint on BUE support                           ADL either performed or assessed with clinical judgement   ADL Overall ADL's : Needs assistance/impaired     Grooming: Min guard;Standing   Upper Body Bathing: Min guard;Sitting   Lower Body Bathing: Minimal assistance;Sit to/from stand   Upper Body Dressing : Min guard;Sitting   Lower Body Dressing: Minimal assistance;Sit to/from stand   Toilet Transfer: Min guard;Ambulation;RW;BSC           Functional mobility during ADLs: Min guard;Rolling walker;Cueing for sequencing;Cueing for safety General ADL Comments: pt limited by impaired balance, decreased activity tolerance and cognition      Vision         Perception     Praxis      Pertinent Vitals/Pain Pain Assessment: Faces Faces Pain Scale: Hurts a little bit Pain Location: back Pain Descriptors / Indicators: Aching;Sore Pain Intervention(s): Monitored during session;Repositioned;Limited activity within patient's tolerance     Hand Dominance Right   Extremity/Trunk Assessment Upper Extremity  Assessment Upper Extremity Assessment: Overall WFL for tasks assessed   Lower Extremity Assessment Lower Extremity Assessment: Defer to PT evaluation   Cervical / Trunk Assessment Cervical /  Trunk Assessment: Other exceptions Cervical / Trunk Exceptions: lumbar compression fracture   Communication Communication Communication: No difficulties   Cognition Arousal/Alertness: Awake/alert Behavior During Therapy: WFL for tasks assessed/performed Overall Cognitive Status: Impaired/Different from baseline Area of Impairment: Memory;Attention;Following commands;Safety/judgement;Awareness;Problem solving                   Current Attention Level: Focused Memory: Decreased recall of precautions;Decreased short-term memory Following Commands: Follows one step commands with increased time;Follows multi-step commands inconsistently Safety/Judgement: Decreased awareness of safety;Decreased awareness of deficits Awareness: Emergent Problem Solving: Slow processing;Decreased initiation;Difficulty sequencing;Requires verbal cues;Requires tactile cues General Comments: Short blessed test completed: pt scoring 18/28 (impairment consistent to dementia); noted deficits in safety, memory, attention, sequencing and problem solving    General Comments       Exercises     Shoulder Instructions      Home Living Family/patient expects to be discharged to:: Private residence Living Arrangements: Spouse/significant other;Children Available Help at Discharge: Family;Available PRN/intermittently Type of Home: House Home Access: Stairs to enter Entrance Stairs-Number of Steps: 1   Home Layout: Two level;Able to live on main level with bedroom/bathroom     Bathroom Shower/Tub: (further info needed)   Bathroom Toilet: Standard     Home Equipment: Walker - 2 wheels;Bedside commode          Prior Functioning/Environment Level of Independence: Independent with assistive device(s)        Comments: pt reports independnet; per PT eval: patient's cousin, Nunziata helps with meds, assists with other needs- comes by daily.        OT Problem List: Decreased strength;Decreased activity  tolerance;Impaired balance (sitting and/or standing);Decreased cognition;Decreased safety awareness;Decreased knowledge of use of DME or AE;Decreased knowledge of precautions;Pain;Impaired UE functional use      OT Treatment/Interventions: Self-care/ADL training;DME and/or AE instruction;Therapeutic activities;Cognitive remediation/compensation;Balance training;Patient/family education;Therapeutic exercise    OT Goals(Current goals can be found in the care plan section) Acute Rehab OT Goals Patient Stated Goal: to get home  OT Goal Formulation: With patient Time For Goal Achievement: 08/18/19 Potential to Achieve Goals: Good  OT Frequency: Min 2X/week   Barriers to D/C: Decreased caregiver support          Co-evaluation              AM-PAC OT "6 Clicks" Daily Activity     Outcome Measure Help from another person eating meals?: None Help from another person taking care of personal grooming?: A Little Help from another person toileting, which includes using toliet, bedpan, or urinal?: A Little Help from another person bathing (including washing, rinsing, drying)?: A Little Help from another person to put on and taking off regular upper body clothing?: A Little Help from another person to put on and taking off regular lower body clothing?: A Little 6 Click Score: 19   End of Session Equipment Utilized During Treatment: Rolling walker;Back brace Nurse Communication: Mobility status;Precautions  Activity Tolerance: Patient tolerated treatment well Patient left: in bed;with call bell/phone within reach;with bed alarm set  OT Visit Diagnosis: Other abnormalities of gait and mobility (R26.89);Unsteadiness on feet (R26.81);Muscle weakness (generalized) (M62.81);Pain Pain - part of body: (back)                Time: 1443-1540 OT Time Calculation (min): 23 min Charges:  OT General Charges $  OT Visit: 1 Visit OT Evaluation $OT Eval Moderate Complexity: 1 Mod OT Treatments $Self  Care/Home Management : 8-22 mins  Chancy Milroyhristie S Jamespaul Secrist, OT Acute Rehabilitation Services Pager [phone] Office [phone]   Chancy MilroyChristie S Najeh Credit 08/04/2019, 10:58 AM

## 2019-08-04 NOTE — Progress Notes (Signed)
Pt was yelling out "Webb Silversmith! Webb Silversmith! Why wont you answer me?" This RN found pt sitting up right, naked, and yelling at his reflection in the window. RN redirected pt and cleaned him up d/t condom cath coming off and pt was wet. Pt states he is losing it. RN informed pt that this happens sometimes after being in the hospital for a while. Pt refused for RN to call wife for pt to talk to to help reassure him.  Pt stated he was fine and was going to just try to go to sleep. Bed alarm is on. Pt is resting. Will continue to monitor.   Eleanora Neighbor, RN

## 2019-08-04 NOTE — Progress Notes (Addendum)
Family Medicine Teaching Service Daily Progress Note Intern Pager: [phone]  Patient name: Todd Bright Medical record number: [id number] Date of birth: April 10, 1941 Age: 78 y.o. Gender: male  Primary Care Provider: Dione Housekeeper, MD Consultants: Neurosurgery Code Status:   Code Status: DNR Preferred Emergency Contact: Todd Bright (1st cousin, caretaker) [phone] (home number)  Pt Overview and Major Events to Date:  Todd Bright is a 78 y.o. male presenting with lumbar fracture from recent fall. PMH is significant for HTN, CKD, A.Fib with Cardioversion  Assessment and Plan:   Acute L2 vertebral fracture s/p fall Patient presenting with L2 vertebral fracture status post fall.  Has no neurologic deficits on exam.  Likely has osteoporosis given multiple fractures including chronic T12 fracture seen on imaging and recent fractures including right hip hip fracture status post surgical fixation with intramedullary nail in 06/2019.  Will likely need a bisphosphonate after fracture healing is occurred.  Patient evaluated in ED by neurosurgery, do not recommend surgery at this time.  They recommended outpatient follow-up, TLSO brace, but mobilization, with PT OT evaluation.  PT recommends HHPT. OT eval currently pending. Patient complaining of mild pain in his lower back, currently well controlled with PO pain medication.  -pulse ox monitoring -PT/OT evaluation -vital signs per unit -Oxycodone IR 5 mg every 4 hours as needed -Acetaminophen 650 mg as needed - follow up outpt w/ Neurosurgery - mobilize in TLSO brace, at bedside - consider calcitonin for pain control given fracture  Paroxysmal Afib  Initially presented with atrial fibrillation with RVR, now currently rate controlled.  Home medication Cardizem 120 mg daily. Not currently on anticoagulation. Per chart review, was previously on xarelto but has refused to take it as well as follow up with Cardiology. CHA2DS2VASc 3, HAS BLED 2 not  indicating major risk of bleeding. -Continue diltiazem 120 mg daily.   Anxiety Medication Lexapro and lorazepam 0.5 mg. -Continue home medication  Right hip fracture SP intramedullary hip pain 06/06/19 Sustained after fall 06/2019. Mobilizes with walker. Working with PT outpatient. Had received lovenox, finished course. -Gabapentin 100 mg qhs  Constipation MiraLAX and Senokot at home. -Continue home medication  Hyperlipidemia Home medication simvastatin 10 mg -Continue home meds  H/o CKD3 Per chart review, baseline appears to be around 1.1-1.3, however Cr 0.84 on admission.  FEN/GI: Regular diet   Prophylaxis: Lovenox  Disposition:  Pending PT/OT evaluation  Subjective:  Patient says pain is well controlled. Would like to have a bowel movement. Encouraged patient to take his PRN Senakot.   Objective: Temp:  [98.1 F (36.7 C)-98.2 F (36.8 C)] 98.1 F (36.7 C) (11/01 0601) Pulse Rate:  [80-108] 108 (11/01 0601) Resp:  [18] 18 (11/01 0601) BP: (104-153)/(80-100) 153/100 (11/01 0601) SpO2:  [98 %-100 %] 99 % (11/01 0601) Physical Exam: General: NAD, well appearing CVS: RRR, no MRG Lungs: CTAB, no increased work of breathing Abdomen: Soft, nontender, nondistended MSK: No lower extremity edema, able to move lower extremities without issue, 5 of 5 strength, normal sensation intact.   Laboratory: Recent Labs  Lab 08/02/19 0814 08/03/19 0432  WBC 8.5 7.0  HGB 15.9 14.2  HCT 48.1 41.7  PLT 276 283   Recent Labs  Lab 08/02/19 0814 08/03/19 0432  NA 140 136  K 4.0 3.6  CL 102 99  CO2 26 25  BUN 10 12  CREATININE 0.84 0.89  CALCIUM 8.9 8.7*  GLUCOSE 103* 79      Imaging/Diagnostic Tests: Dg Lumbar Spine Complete  Result  Date: 08/02/2019 CLINICAL DATA:  Pain following fall EXAM: LUMBAR SPINE - COMPLETE 4+ VIEW COMPARISON:  None FINDINGS: Frontal, lateral, spot lumbosacral lateral, and bilateral oblique views were obtained. There is an acute  appearing fracture of the L2 vertebral body with marked wedging at this level. A bony fragment is noted anterior to the L2 vertebral body. There is a lucency extending through a portion of the L3 vertebral body concerning for nondisplaced fracture in this area. There is a probable old wedge fracture at T12. There is 6 mm of anterolisthesis of L5 on S1. No other spondylolisthesis. There is moderately severe disc space narrowing at L5-S1. There is mild disc space narrowing at L2-3. There is facet osteoarthritic change to varying degrees at all levels, most notably at L4-5 and L5-S1 bilaterally. There is aortic atherosclerosis. IMPRESSION: Severe wedging of the L2 vertebral body which appears acute with a bony fragment anterior to the L2 vertebral body. Nondisplaced fracture apparent in the mid to posterior aspect of the L3 vertebral body. Probable old wedge fracture at T12. 6 mm of anterolisthesis of L5 on S1 which is likely due to underlying spondylosis. Severe disc space narrowing noted at L5-S1. Multifocal facet arthropathy noted. Aortic Atherosclerosis (ICD10-I70.0). Electronically Signed   By: Bretta BangWilliam  Woodruff III M.D.   On: 08/02/2019 09:10   Ct Lumbar Spine Wo Contrast  Result Date: 08/02/2019 CLINICAL DATA:  Larey SeatFell.  Injured back. EXAM: CT LUMBAR SPINE WITHOUT CONTRAST TECHNIQUE: Multidetector CT imaging of the lumbar spine was performed without intravenous contrast administration. Multiplanar CT image reconstructions were also generated. COMPARISON:  Lumbar radiographs 08/02/2019 FINDINGS: Segmentation: There are five lumbar type vertebral bodies. The last full intervertebral disc space is labeled L5-S1. Alignment: Bilateral pars defects at L5 with a grade 1 spondylolisthesis. There is also mild degenerative anterolisthesis of L4. The other vertebral bodies are normally aligned. Vertebrae: There is a remote appearing compression fracture of T12. No retropulsion or canal compromise. Acute compression  fracture of L2 with approximately 60% central compression of the superior endplate. There is mild retropulsion of the posterosuperior aspect of vertebral body but no significant canal stenosis. No involvement of the pedicles or posterior elements. The other lumbar vertebral bodies are maintained. No acute fracture. There is moderate to advanced osteoporosis noted. Multilevel facet disease. Paraspinal and other soft tissues: No significant paraspinal or retroperitoneal findings. Small amount of paraspinal hematoma at L2. Disc levels: T12-L1: No significant findings. L1-2: Mild retropulsion involving the posterosuperior aspect of L2 with mild flattening of the ventral thecal sac but no significant canal stenosis. No significant foraminal stenosis. L2-3: Mild diffuse annular bulge with flattening of the ventral thecal sac but no large disc protrusion, spinal or significant foraminal stenosis. L3-4: Bulging annulus and facet disease but no significant spinal or foraminal stenosis. L4-5: Postoperative changes with a right-sided laminectomy defect. There is a bulging uncovered disc, facet disease and ligamentum flavum thickening contributing to mild spinal and bilateral lateral recess stenosis. There is also mild bilateral foraminal stenosis. L5-S1: Bulging uncovered disc but the canal is generous and there is no significant spinal stenosis. Severe facet disease. Mild to moderate bilateral foraminal stenosis. IMPRESSION: 1. Acute L2 compression fracture with mild retropulsion but no significant canal compromise. 2. Remote T12 compression fracture. 3. Osteoporosis. 4. 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 5. Mild multifactorial spinal and bilateral lateral recess stenosis and bilateral foraminal stenosis at L4-5. 6. Mild to moderate bilateral  foraminal stenosis at L5-S1. Electronically Signed   By: Rudie Meyer M.D.   On: 08/02/2019 10:39     Garnette Gunner, MD 08/04/2019, 6:09 AM PGY-3, [facility] FPTS Intern pager: [phone], text pages welcome

## 2019-08-05 DIAGNOSIS — S32029D Unspecified fracture of second lumbar vertebra, subsequent encounter for fracture with routine healing: Secondary | ICD-10-CM

## 2019-08-05 MED ORDER — GABAPENTIN 100 MG PO CAPS: 200.0000 mg | ORAL_CAPSULE | Freq: Three times a day (TID) | ORAL | 0 refills | Status: AC

## 2019-08-05 MED ORDER — ESCITALOPRAM OXALATE 10 MG PO TABS: 20.0000 mg | ORAL_TABLET | Freq: Every day | ORAL | 0 refills | Status: AC

## 2019-08-05 NOTE — Consult Note (Signed)
[facility] Nurse wound consult note Reason for Consult: skin tears Per nursing flowsheet  Left and right elbow, right shin, left knee Wound type: skin tears patient with fall at home; presented with skin tears  Pressure Injury POA: NA Measurement: see nursing flow sheet  Dressing procedure/placement/frequency: Foam dressings implemented per skin tear order set, added xeroform for antibacterial and exudate management. Change every 3 days.   Discussed POC with patient and bedside nurse.  Re consult if needed, will not follow at this time. Thanks  Keyaria Lawson [employer], RN,CWOCN, CNS, [place] [phone])
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
## 2019-08-05 NOTE — Care Management Important Message (Signed)
Important Message  Patient Details  Name: Todd Bright MRN: [id number] Date of Birth: [birth year]   Medicare Important Message Given:  Yes     [name] 08/05/2019, 4:34 PM

## 2019-08-05 NOTE — Progress Notes (Signed)
DISCHARGE NOTE HOME Todd Bright to be discharged Home per MD order. Discussed prescriptions and follow up appointments with the patient. Prescriptions given to patient; medication list explained in detail. Patient verbalized understanding.  Skin clean, dry and intact without evidence of skin break down, no evidence of skin tears noted. IV catheter discontinued intact. Site without signs and symptoms of complications. Dressing and pressure applied. Pt denies pain at the site currently. No complaints noted.  Patient free of lines, drains, and wounds.   An After Visit Summary (AVS) was printed and given to the patient. Patient escorted via wheelchair, and discharged home via private auto.  Arlyss Repress, RN

## 2019-08-05 NOTE — Plan of Care (Signed)
  Problem: Activity: Goal: Risk for activity intolerance will decrease Outcome: Progressing   

## 2019-08-05 NOTE — Progress Notes (Signed)
Occupational Therapy Treatment Patient Details Name: Todd Bright MRN: [id number] DOB: [birth year] Today's Date: 08/05/2019    History of present illness 78 y.o male who presented from home after falling- found to have L2 compression fx. PMH includes falls, arrythmia, chronic A-fib, chronic kidney disease and hypertension.   OT comments  Pt recall 1/3 back precautions this session requiring visual cues to recall remaining 2/3. Patient was unable to verbalize proper way to get in/out of bed requiring verbal and tactile instructions to complete log roll. OT instruct patient in adaptive equipment for lower body dressing while seated edge of bed. Patient return demo with shoe horn to don shoes. Patient is min guard with verbal cues for safety to transfer to bedside chair with rolling walker.   Follow Up Recommendations  Home health OT;Supervision/Assistance - 24 hour (if 24/7 supervision not available recommend SNF)    Equipment Recommendations  (Adaptive equipment for lower body dressing)       Precautions / Restrictions Precautions Precautions: Fall;Back Precaution Booklet Issued: No Precaution Comments: reviewed back precautions  Required Braces or Orthoses: Spinal Brace Spinal Brace: Lumbar corset;Applied in supine position Restrictions Weight Bearing Restrictions: No       Mobility Bed Mobility Overal bed mobility: Needs Assistance Bed Mobility: Rolling(log roll ) Rolling: Min guard(verbal cues to sequence)            Transfers Overall transfer level: Needs assistance Equipment used: Rolling walker (2 wheeled) Transfers: Sit to/from Stand Sit to Stand: Min guard         General transfer comment: verbal cues for body mechanics    Balance Overall balance assessment: Needs assistance Sitting-balance support: Feet supported Sitting balance-Leahy Scale: Fair     Standing balance support: Bilateral upper extremity supported;During functional activity Standing  balance-Leahy Scale: Poor Standing balance comment: rolling walker for B UE support                           ADL either performed or assessed with clinical judgement   ADL Overall ADL's : Needs assistance/impaired                     Lower Body Dressing: Set up;Cueing for safety;Cueing for back precautions(use of AE shoe horn to don shoes)               Functional mobility during ADLs: Min guard;Rolling walker;Cueing for sequencing;Cueing for safety General ADL Comments: recalled 1/3 back precautions, could not recall log roll technique for OOB. moderate verbal cues to redirect to functional tasks               Cognition Arousal/Alertness: Awake/alert Behavior During Therapy: [facility] for tasks assessed/performed Overall Cognitive Status: Impaired/Different from baseline Area of Impairment: Memory;Attention;Following commands;Safety/judgement;Awareness;Problem solving                   Current Attention Level: Focused Memory: Decreased recall of precautions;Decreased short-term memory Following Commands: Follows one step commands with increased time;Follows multi-step commands inconsistently Safety/Judgement: Decreased awareness of safety;Decreased awareness of deficits Awareness: Emergent Problem Solving: Slow processing;Decreased initiation;Difficulty sequencing;Requires verbal cues;Requires tactile cues General Comments: tangential                   Pertinent Vitals/ Pain       Pain Assessment: 0-10 Pain Score: 2  Pain Location: back Pain Descriptors / Indicators: Aching;Sore Pain Intervention(s): Limited activity within patient's tolerance;Monitored during session;Repositioned  Frequency  Min 2X/week        Progress Toward Goals  OT Goals(current goals can now be found in the care plan section)  Progress towards OT goals: Progressing toward goals  Acute Rehab OT Goals Patient Stated Goal: to get home  OT Goal  Formulation: With patient Time For Goal Achievement: 08/18/19 Potential to Achieve Goals: Good ADL Goals Pt Will Perform Grooming: with modified independence;sitting;standing Pt Will Perform Lower Body Bathing: with supervision;sit to/from stand Pt Will Perform Lower Body Dressing: with supervision;with adaptive equipment;sit to/from stand Pt Will Transfer to Toilet: bedside commode;ambulating;with supervision Pt Will Perform Toileting - Clothing Manipulation and hygiene: with supervision;sit to/from stand Additional ADL Goal #1: Pt will recall back precautions with supervision and manage back brace with independence.  Plan Discharge plan remains appropriate;Other (comment)(if 24/7 SUP not available at home, recommend SNF)       AM-PAC OT "6 Clicks" Daily Activity     Outcome Measure   Help from another person eating meals?: None Help from another person taking care of personal grooming?: A Little Help from another person toileting, which includes using toliet, bedpan, or urinal?: A Little Help from another person bathing (including washing, rinsing, drying)?: A Little Help from another person to put on and taking off regular upper body clothing?: A Little Help from another person to put on and taking off regular lower body clothing?: A Little 6 Click Score: 19    End of Session Equipment Utilized During Treatment: Rolling walker;Back brace  OT Visit Diagnosis: Unsteadiness on feet (R26.81);Other abnormalities of gait and mobility (R26.89);History of falling (Z91.81);Pain Pain - part of body: (Back)   Activity Tolerance Patient tolerated treatment well   Patient Left in chair;with call bell/phone within reach;with chair alarm set   Nurse Communication Mobility status;Precautions        Time: 1030-1110 OT Time Calculation (min): 40 min  Charges: OT General Charges $OT Visit: 1 Visit OT Treatments $Self Care/Home Management : 38-52 mins  08/05/2019  Myrtie Neither OT OT  office: [phone]   Carmelia Roller 08/05/2019, 12:50 PM

## 2019-08-05 NOTE — Progress Notes (Signed)
Subjective: Patient reports no back pain. He has not had any more muscle spasms.   Objective: Vital signs in last 24 hours: Temp:  [97.7 F (36.5 C)-98 F (36.7 C)] 97.7 F (36.5 C) (11/02 0607) Pulse Rate:  [49-103] 49 (11/02 0607) Resp:  [18] 18 (11/02 0607) BP: (128-141)/(92-99) 133/92 (11/02 0607) SpO2:  [97 %-99 %] 98 % (11/02 0607)  Intake/Output from previous day: 11/01 0701 - 11/02 0700 In: 1260 [P.O.:1260] Out: 1450 [Urine:1450] Intake/Output this shift: No intake/output data recorded.  Neurologic: Grossly normal  Lab Results: Lab Results  Component Value Date   WBC 7.9 08/04/2019   HGB 14.6 08/04/2019   HCT 43.2 08/04/2019   MCV 94.7 08/04/2019   PLT 318 08/04/2019   Lab Results  Component Value Date   INR 1.1 08/03/2019   BMET Lab Results  Component Value Date   NA 135 08/04/2019   K 3.8 08/04/2019   CL 99 08/04/2019   CO2 25 08/04/2019   GLUCOSE 92 08/04/2019   BUN 12 08/04/2019   CREATININE 1.07 08/04/2019   CALCIUM 8.7 (L) 08/04/2019    Studies/Results: Dg Lumbar Spine 2-3 Views  Result Date: 08/04/2019 CLINICAL DATA:  L2 vertebral fracture. EXAM: LUMBAR SPINE - 2-3 VIEW COMPARISON:  August 02, 2019 FINDINGS: Anterolisthesis of L5 versus S1 is again identified measuring 11 mm on this study versus 6 mm August 02, 2019. Anterolisthesis of L4 versus L5 measures 8 mm, not seen previously. No other malalignment. The acute compression fracture of L2 appears to have progressed in the interval with increased loss of height of L2. Anterior wedging of T12 is stable. IMPRESSION: 1. Interval progression of the acute L2 compression fracture with increasing loss of height centrally. 2. Chronic compression fracture of T12. 3. The anterolisthesis of L5 versus S1 is now grade 2 measuring 11 mm on this study versus 6 mm on the August 02, 2019 study. 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 4. 8 mm of anterolisthesis of L4 versus L5 is seen today but not previously.  Electronically Signed   By: Dorise Bullion III M.D   On: 08/04/2019 18:33    Assessment/Plan: 78 year old with an L2 fracture. Patient seems to be getting more delirious. If he is able to ambulate safely with brace on, ok to discharge and follow up with [place] 2 weeks after discharge.     LOS: 3 days    Ocie Cornfield [facility] 08/05/2019, 7:46 AM

## 2019-08-05 NOTE — TOC Transition Note (Addendum)
Transition of Care [facility]) - CM/SW Discharge Note   Patient Details  Name: Todd Bright MRN: [id number] Date of Birth: [birth year]  Transition of Care [facility]) CM/SW Contact:  Bartholomew Crews, RN Phone Number: [phone] 08/05/2019, 12:44 PM   Clinical Narrative:    Spoke with patient at the bedside to discuss transition planning. Patient advised to call his cousin, Todd Bright. Spoke with Todd Bright on the phone to offer choice for home health agency. Referral placed to [facility] - pending. Todd Bright to provide transportation home today when patient is ready. No further TOC needs identified at this time.   Update - [facility] accepted [facility] referral for PT and OT. Orders placed by MD.   Final next level of care: Home w Home Health Services Barriers to Discharge: No Barriers Identified   Patient Goals and CMS Choice   CMS Medicare.gov Compare Post Acute Care list provided to:: Patient Choice offered to / list presented to : Patient  Discharge Placement                       Discharge Plan and Services   Discharge Planning Services: CM Consult Post Acute Care Choice: Home Health          DME Arranged: N/A DME Agency: NA       HH Arranged: PT, OT [place] Agency: [facility] (formerly [employer]) Date [place]: 08/05/19 Time [place]: 1243 Representative spoke with at [place]: [place] - pending referral  Social Determinants of Health ([place]) Interventions     Readmission Risk Interventions No flowsheet data found.

## 2019-08-17 ENCOUNTER — Other Ambulatory Visit: Payer: Self-pay | Admitting: Family Medicine

## 2019-08-30 ENCOUNTER — Emergency Department (HOSPITAL_COMMUNITY): Payer: Medicare Other

## 2019-08-30 ENCOUNTER — Other Ambulatory Visit: Payer: Self-pay

## 2019-08-30 ENCOUNTER — Encounter (HOSPITAL_COMMUNITY): Payer: Self-pay | Admitting: Emergency Medicine

## 2019-08-30 ENCOUNTER — Emergency Department (HOSPITAL_COMMUNITY)
Admission: EM | Admit: 2019-08-30 | Discharge: 2019-08-30 | Disposition: A | Discharge status: Home / Self Care | Payer: Medicare Other | Attending: Emergency Medicine | Admitting: Emergency Medicine

## 2019-08-30 DIAGNOSIS — N183 Chronic kidney disease, stage 3 unspecified: Secondary | ICD-10-CM | POA: Insufficient documentation

## 2019-08-30 DIAGNOSIS — I480 Paroxysmal atrial fibrillation: Secondary | ICD-10-CM | POA: Diagnosis not present

## 2019-08-30 DIAGNOSIS — Z20828 Contact with and (suspected) exposure to other viral communicable diseases: Secondary | ICD-10-CM | POA: Insufficient documentation

## 2019-08-30 DIAGNOSIS — Z79899 Other long term (current) drug therapy: Secondary | ICD-10-CM | POA: Insufficient documentation

## 2019-08-30 DIAGNOSIS — Z87891 Personal history of nicotine dependence: Secondary | ICD-10-CM | POA: Insufficient documentation

## 2019-08-30 DIAGNOSIS — E876 Hypokalemia: Secondary | ICD-10-CM | POA: Insufficient documentation

## 2019-08-30 DIAGNOSIS — E440 Moderate protein-calorie malnutrition: Secondary | ICD-10-CM | POA: Insufficient documentation

## 2019-08-30 DIAGNOSIS — I129 Hypertensive chronic kidney disease with stage 1 through stage 4 chronic kidney disease, or unspecified chronic kidney disease: Secondary | ICD-10-CM | POA: Insufficient documentation

## 2019-08-30 DIAGNOSIS — R531 Weakness: Secondary | ICD-10-CM | POA: Diagnosis not present

## 2019-08-30 DIAGNOSIS — R0602 Shortness of breath: Secondary | ICD-10-CM | POA: Diagnosis present

## 2019-08-30 DIAGNOSIS — I4891 Unspecified atrial fibrillation: Secondary | ICD-10-CM

## 2019-08-30 LAB — CBC
HCT: 39.7 % (ref 39.0–52.0)
Hemoglobin: 12.8 g/dL — ABNORMAL LOW (ref 13.0–17.0)
MCH: 31.2 pg (ref 26.0–34.0)
MCHC: 32.2 g/dL (ref 30.0–36.0)
MCV: 96.8 fL (ref 80.0–100.0)
Platelets: 205 10*3/uL (ref 150–400)
RBC: 4.1 MIL/uL — ABNORMAL LOW (ref 4.22–5.81)
RDW: 13 % (ref 11.5–15.5)
WBC: 6.1 10*3/uL (ref 4.0–10.5)
nRBC: 0 % (ref 0.0–0.2)

## 2019-08-30 LAB — COMPREHENSIVE METABOLIC PANEL
ALT: 17 U/L (ref 0–44)
AST: 28 U/L (ref 15–41)
Albumin: 2.5 g/dL — ABNORMAL LOW (ref 3.5–5.0)
Alkaline Phosphatase: 115 U/L (ref 38–126)
Anion gap: 9 (ref 5–15)
BUN: 8 mg/dL (ref 8–23)
CO2: 22 mmol/L (ref 22–32)
Calcium: 7.4 mg/dL — ABNORMAL LOW (ref 8.9–10.3)
Chloride: 106 mmol/L (ref 98–111)
Creatinine, Ser: 0.98 mg/dL (ref 0.61–1.24)
GFR calc Af Amer: >60 mL/min (ref >60)
GFR calc non Af Amer: >60 mL/min (ref >60)
Glucose, Bld: 112 mg/dL — ABNORMAL HIGH (ref 70–99)
Potassium: 3.4 mmol/L — ABNORMAL LOW (ref 3.5–5.1)
Sodium: 137 mmol/L (ref 135–145)
Total Bilirubin: 0.9 mg/dL (ref 0.3–1.2)
Total Protein: 5.8 g/dL — ABNORMAL LOW (ref 6.5–8.1)

## 2019-08-30 LAB — URINALYSIS, ROUTINE W REFLEX MICROSCOPIC
Bacteria, UA: NONE SEEN
Bilirubin Urine: NEGATIVE
Glucose, UA: 50 mg/dL — AB
Ketones, ur: NEGATIVE mg/dL
Leukocytes,Ua: NEGATIVE
Nitrite: NEGATIVE
Protein, ur: NEGATIVE mg/dL
Specific Gravity, Urine: 1.004 — ABNORMAL LOW (ref 1.005–1.030)
pH: 7 (ref 5.0–8.0)

## 2019-08-30 LAB — CBG MONITORING, ED: Glucose-Capillary: 105 mg/dL — ABNORMAL HIGH (ref 70–99)

## 2019-08-30 LAB — TROPONIN I (HIGH SENSITIVITY)
Troponin I (High Sensitivity): 8 ng/L (ref <18)
Troponin I (High Sensitivity): 9 ng/L (ref <18)

## 2019-08-30 LAB — BRAIN NATRIURETIC PEPTIDE: B Natriuretic Peptide: 288 pg/mL — ABNORMAL HIGH (ref 0.0–100.0)

## 2019-08-30 LAB — POC SARS CORONAVIRUS 2 AG -  ED: SARS Coronavirus 2 Ag: NEGATIVE

## 2019-08-30 MED ORDER — CALCIUM CARBONATE 1250 (500 CA) MG PO TABS
1.0000 | ORAL_TABLET | Freq: Once | ORAL | Status: AC
  Administered 2019-08-30: 14:00:00 500 mg via ORAL
  Filled 2019-08-30: qty 1

## 2019-08-30 MED ORDER — SODIUM CHLORIDE 0.9 % IV SOLN: INTRAVENOUS | Status: DC

## 2019-08-30 MED ORDER — POTASSIUM CHLORIDE CRYS ER 20 MEQ PO TBCR
40.0000 meq | EXTENDED_RELEASE_TABLET | Freq: Once | ORAL | Status: AC
  Administered 2019-08-30: 40 meq via ORAL
  Filled 2019-08-30: qty 2

## 2019-08-30 NOTE — Discharge Instructions (Addendum)
It was our pleasure to provide your ER care today - we hope that you feel better.  Rest. Drink plenty of fluids. Eat balanced diet. Consider supplementing nutrition with Boost, Ensure or other nutritious shakes. Fall precautions - use great care and/or assistance to help decrease risk of falling. Make sure to take your medication as prescribed by your doctor.   From today's lab tests, your potassium level and calcium levels are mildly low - consider taking multivitamin with potassium and calcium supplement. Follow up with primary care doctor.   Your ct scan was read as showing no acute process, however incidental note was made of enlarged ventricles - follow up with your doctor and/or neurologist in the next few weeks.   Return to ER if worse, new symptoms, fevers, increased trouble breathing, new or severe pain, weak/fainting, or other concern.  We have made a home health referral  - they should be contacting you in the next couple of days.

## 2019-08-30 NOTE — ED Provider Notes (Addendum)
[place] EMERGENCY DEPARTMENT Provider Note   CSN: [id number] Arrival date & time: 08/30/19  6599     History   Chief Complaint Chief Complaint  Patient presents with  . Weakness  . Atrial Fibrillation    HPI Todd Bright is a 78 y.o. male.     Patient with hx afib, with general weakness, and noted to be in afib with hr 160s. EMS found blood sugar mildly low in 60s - gave po fluids/food - repeat 100. EMS also gave cardizem iv with improvement in hr. Patients symptoms gradual onset for past few days, moderate-severe, persistent, felt worse today. No fevers. Denies cough or uri symptoms. No known covid+ exposure. Denies chest pain. +sob. No vomiting or diarrhea. Pt notes very poor po intake in past few days. Ems noted previous call to home in past week due to fall, hr in 150 range then but patient had refused transport. EMS questions whether patient reliably taking home meds. Patient limited historian - level 5 caveat.   The history is provided by the patient and the EMS personnel. The history is limited by the condition of the patient.    Past Medical History:  Diagnosis Date  . AKI (acute kidney injury) ([place]) 06/05/2019  . Arrhythmia   . Atrial fibrillation ([place])   . Cataracts, both eyes   . CKD (chronic kidney disease), stage III   . Hypertension     Patient Active Problem List   Diagnosis Date Noted  . Osteoporosis 08/03/2019  . Compression fracture of body of thoracic vertebra ([place]) 08/02/2019  . Closed fracture of second lumbar vertebra ([place])   . Fall   . Closed intertrochanteric fracture of hip, right, initial encounter ([place]) 06/05/2019  . Atrial fibrillation, chronic ([place]) 06/05/2019  . Essential hypertension 06/05/2019  . CKD (chronic kidney disease), stage III 06/05/2019    Past Surgical History:  Procedure Laterality Date  . CARDIOVERSION    . INTRAMEDULLARY (IM) NAIL INTERTROCHANTERIC Right 06/06/2019   Procedure: INTRAMEDULLARY (IM)  NAIL INTERTROCHANTRIC;  Surgeon: Renette Butters, MD;  Location: [place];  Service: Orthopedics;  Laterality: Right;        Home Medications    Prior to Admission medications   Medication Sig Start Date End Date Taking? Authorizing Provider  diltiazem (CARDIZEM) 120 MG tablet Take 120 mg by mouth daily. 05/09/19   [provider]  escitalopram (LEXAPRO) 10 MG tablet Take 2 tablets (20 mg total) by mouth daily. 08/05/19   Guadalupe Dawn, MD  gabapentin (NEURONTIN) 100 MG capsule Take 100 mg by mouth at bedtime.    [provider]  gabapentin (NEURONTIN) 100 MG capsule Take 2 capsules (200 mg total) by mouth 3 (three) times daily. 08/05/19   Guadalupe Dawn, MD  LORazepam (ATIVAN) 0.5 MG tablet Take 0.5 mg by mouth 3 (three) times daily.    [provider]  Melatonin 3 MG TABS Take 3 mg by mouth at bedtime.    [provider]  Multiple Vitamin (MULTIVITAMIN WITH MINERALS) TABS tablet Take 1 tablet by mouth daily.    [provider]  Omega-3 Fatty Acids (FISH OIL) 1000 MG CPDR Take 1,000 mg by mouth daily.    [provider]  polyethylene glycol (MIRALAX / GLYCOLAX) 17 g packet Take 17 g by mouth daily as needed for mild constipation. 06/08/19   Amin, Ankit Chirag, MD  senna-docusate (SENOKOT-S) 8.6-50 MG tablet Take 2 tablets by mouth at bedtime as needed for mild constipation  or moderate constipation. 06/08/19   Amin, Ankit Chirag, MD  simvastatin (ZOCOR) 10 MG tablet TAKE 1 TABLET (10 MG TOTAL) DAILY BY MOUTH. PT NEEDS APPT FOR REFILLS Patient taking differently: Take 10 mg by mouth every evening.  09/06/17   Sherran Needs, NP    Family History Family History  Problem Relation Age of Onset  . Heart attack Mother   . Emphysema Father   . Cancer Sister     Social History Social History   Tobacco Use  . Smoking status: Former Smoker    Quit date: 05/21/2015    Years since quitting: 4.2  . Smokeless tobacco: Never Used  Substance Use  Topics  . Alcohol use: No  . Drug use: No     Allergies   Patient has no known allergies.   Review of Systems Review of Systems  Constitutional: Negative for fever.  HENT: Negative for sore throat.   Eyes: Negative for visual disturbance.  Respiratory: Positive for shortness of breath. Negative for cough.   Cardiovascular: Negative for chest pain and leg swelling.  Gastrointestinal: Negative for abdominal pain, diarrhea and vomiting.  Endocrine: Negative for polyuria.  Genitourinary: Negative for dysuria.  Musculoskeletal: Negative for back pain and neck pain.  Skin: Negative for rash.  Neurological: Negative for numbness and headaches.  Hematological: Does not bruise/bleed easily.  Psychiatric/Behavioral: Negative for agitation.     Physical Exam Updated Vital Signs BP (!) 71/55   Pulse (!) 58   Temp 97.6 F (36.4 C) (Oral)   Resp 12   Ht 1.829 m (6')   SpO2 98%   BMI 23.06 kg/m   Physical Exam Vitals signs and nursing note reviewed.  Constitutional:      Appearance: Normal appearance. He is well-developed.  HENT:     Head: Atraumatic.     Nose: Nose normal.     Mouth/Throat:     Mouth: Mucous membranes are moist.     Pharynx: Oropharynx is clear.  Eyes:     General: No scleral icterus.    Conjunctiva/sclera: Conjunctivae normal.     Pupils: Pupils are equal, round, and reactive to light.  Neck:     Musculoskeletal: Normal range of motion and neck supple. No neck rigidity.     Trachea: No tracheal deviation.  Cardiovascular:     Rate and Rhythm: Tachycardia present. Rhythm irregular.     Pulses: Normal pulses.     Heart sounds: Normal heart sounds. No murmur. No friction rub. No gallop.   Pulmonary:     Effort: Pulmonary effort is normal. No accessory muscle usage or respiratory distress.     Breath sounds: Normal breath sounds.  Abdominal:     General: Bowel sounds are normal. There is no distension.     Palpations: Abdomen is soft.     Tenderness:  There is no abdominal tenderness. There is no guarding.  Genitourinary:    Comments: No cva tenderness. Musculoskeletal:        General: No swelling or tenderness.  Skin:    General: Skin is warm and dry.     Findings: No rash.  Neurological:     Mental Status: He is alert.     Comments: Alert, speech clear. Motor/sens grossly intact bil.   Psychiatric:        Mood and Affect: Mood normal.      ED Treatments / Results  Labs (all labs ordered are listed, but only abnormal results are displayed) Results for orders  placed or performed during the hospital encounter of 08/30/19  CBC  Result Value Ref Range   WBC 6.1 4.0 - 10.5 K/uL   RBC 4.10 (L) 4.22 - 5.81 MIL/uL   Hemoglobin 12.8 (L) 13.0 - 17.0 g/dL   HCT 39.7 39.0 - 52.0 %   MCV 96.8 80.0 - 100.0 fL   MCH 31.2 26.0 - 34.0 pg   MCHC 32.2 30.0 - 36.0 g/dL   RDW 13.0 11.5 - 15.5 %   Platelets 205 150 - 400 K/uL   nRBC 0.0 0.0 - 0.2 %  CMET  Result Value Ref Range   Sodium 137 135 - 145 mmol/L   Potassium 3.4 (L) 3.5 - 5.1 mmol/L   Chloride 106 98 - 111 mmol/L   CO2 22 22 - 32 mmol/L   Glucose, Bld 112 (H) 70 - 99 mg/dL   BUN 8 8 - 23 mg/dL   Creatinine, Ser 0.98 0.61 - 1.24 mg/dL   Calcium 7.4 (L) 8.9 - 10.3 mg/dL   Total Protein 5.8 (L) 6.5 - 8.1 g/dL   Albumin 2.5 (L) 3.5 - 5.0 g/dL   AST 28 15 - 41 U/L   ALT 17 0 - 44 U/L   Alkaline Phosphatase 115 38 - 126 U/L   Total Bilirubin 0.9 0.3 - 1.2 mg/dL   GFR calc non Af Amer >60 >60 mL/min   GFR calc Af Amer >60 >60 mL/min   Anion gap 9 5 - 15  UA  Result Value Ref Range   Color, Urine STRAW (A) YELLOW   APPearance CLEAR CLEAR   Specific Gravity, Urine 1.004 (L) 1.005 - 1.030   pH 7.0 5.0 - 8.0   Glucose, UA 50 (A) NEGATIVE mg/dL   Hgb urine dipstick MODERATE (A) NEGATIVE   Bilirubin Urine NEGATIVE NEGATIVE   Ketones, ur NEGATIVE NEGATIVE mg/dL   Protein, ur NEGATIVE NEGATIVE mg/dL   Nitrite NEGATIVE NEGATIVE   Leukocytes,Ua NEGATIVE NEGATIVE   RBC / HPF  0-5 0 - 5 RBC/hpf   WBC, UA 0-5 0 - 5 WBC/hpf   Bacteria, UA NONE SEEN NONE SEEN  Brain natriuretic peptide  Result Value Ref Range   B Natriuretic Peptide 288.0 (H) 0.0 - 100.0 pg/mL  CBG monitoring, ED  Result Value Ref Range   Glucose-Capillary 105 (H) 70 - 99 mg/dL   Comment 1 Notify RN    Comment 2 Document in Chart   POC SARS Coronavirus 2 Ag-ED - Nasal Swab (BD Veritor Kit)  Result Value Ref Range   SARS Coronavirus 2 Ag NEGATIVE NEGATIVE  Troponin I (High Sensitivity)  Result Value Ref Range   Troponin I (High Sensitivity) 8 <18 ng/L  Troponin I (High Sensitivity)  Result Value Ref Range   Troponin I (High Sensitivity) 9 <18 ng/L   Dg Lumbar Spine 2-3 Views  Result Date: 08/04/2019 CLINICAL DATA:  L2 vertebral fracture. EXAM: LUMBAR SPINE - 2-3 VIEW COMPARISON:  August 02, 2019 FINDINGS: Anterolisthesis of L5 versus S1 is again identified measuring 11 mm on this study versus 6 mm August 02, 2019. Anterolisthesis of L4 versus L5 measures 8 mm, not seen previously. No other malalignment. The acute compression fracture of L2 appears to have progressed in the interval with increased loss of height of L2. Anterior wedging of T12 is stable. IMPRESSION: 1. Interval progression of the acute L2 compression fracture with increasing loss of height centrally. 2. Chronic compression fracture of T12. 3. The anterolisthesis of L5 versus S1 is now  grade 2 measuring 11 mm on this study versus 6 mm on the August 02, 2019 study. 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 4. 8 mm of anterolisthesis of L4 versus L5 is seen today but not previously. Electronically Signed   By: Dorise Bullion III M.D   On: 08/04/2019 18:33   Dg Lumbar Spine Complete  Result Date: 08/02/2019 CLINICAL DATA:  Pain following fall EXAM: LUMBAR SPINE - COMPLETE 4+ VIEW COMPARISON:  None FINDINGS: Frontal, lateral, spot lumbosacral lateral, and bilateral oblique views were obtained. There is an acute appearing fracture of the  L2 vertebral body with marked wedging at this level. A bony fragment is noted anterior to the L2 vertebral body. There is a lucency extending through a portion of the L3 vertebral body concerning for nondisplaced fracture in this area. There is a probable old wedge fracture at T12. There is 6 mm of anterolisthesis of L5 on S1. No other spondylolisthesis. There is moderately severe disc space narrowing at L5-S1. There is mild disc space narrowing at L2-3. There is facet osteoarthritic change to varying degrees at all levels, most notably at L4-5 and L5-S1 bilaterally. There is aortic atherosclerosis. IMPRESSION: Severe wedging of the L2 vertebral body which appears acute with a bony fragment anterior to the L2 vertebral body. Nondisplaced fracture apparent in the mid to posterior aspect of the L3 vertebral body. Probable old wedge fracture at T12. 6 mm of anterolisthesis of L5 on S1 which is likely due to underlying spondylosis. Severe disc space narrowing noted at L5-S1. Multifocal facet arthropathy noted. Aortic Atherosclerosis (ICD10-I70.0). Electronically Signed   By: Lowella Grip III M.D.   On: 08/02/2019 09:10   Ct Lumbar Spine Wo Contrast  Result Date: 08/02/2019 CLINICAL DATA:  [place].  Injured back. EXAM: CT LUMBAR SPINE WITHOUT CONTRAST TECHNIQUE: Multidetector CT imaging of the lumbar spine was performed without intravenous contrast administration. Multiplanar CT image reconstructions were also generated. COMPARISON:  Lumbar radiographs 08/02/2019 FINDINGS: Segmentation: There are five lumbar type vertebral bodies. The last full intervertebral disc space is labeled L5-S1. Alignment: Bilateral pars defects at L5 with a grade 1 spondylolisthesis. There is also mild degenerative anterolisthesis of L4. The other vertebral bodies are normally aligned. Vertebrae: There is a remote appearing compression fracture of T12. No retropulsion or canal compromise. Acute compression fracture of L2 with  approximately 60% central compression of the superior endplate. There is mild retropulsion of the posterosuperior aspect of vertebral body but no significant canal stenosis. No involvement of the pedicles or posterior elements. The other lumbar vertebral bodies are maintained. No acute fracture. There is moderate to advanced osteoporosis noted. Multilevel facet disease. Paraspinal and other soft tissues: No significant paraspinal or retroperitoneal findings. Small amount of paraspinal hematoma at L2. Disc levels: T12-L1: No significant findings. L1-2: Mild retropulsion involving the posterosuperior aspect of L2 with mild flattening of the ventral thecal sac but no significant canal stenosis. No significant foraminal stenosis. L2-3: Mild diffuse annular bulge with flattening of the ventral thecal sac but no large disc protrusion, spinal or significant foraminal stenosis. L3-4: Bulging annulus and facet disease but no significant spinal or foraminal stenosis. L4-5: Postoperative changes with a right-sided laminectomy defect. There is a bulging uncovered disc, facet disease and ligamentum flavum thickening contributing to mild spinal and bilateral lateral recess stenosis. There is also mild bilateral foraminal stenosis. L5-S1: Bulging uncovered disc but the canal is generous and there is no significant spinal stenosis. Severe facet disease. Mild to moderate bilateral foraminal stenosis.  IMPRESSION: 1. Acute L2 compression fracture with mild retropulsion but no significant canal compromise. 2. Remote T12 compression fracture. 3. Osteoporosis. 4. 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 5. Mild multifactorial spinal and bilateral lateral recess stenosis and bilateral foraminal stenosis at L4-5. 6. Mild to moderate bilateral foraminal stenosis at L5-S1. Electronically Signed   By: Marijo Sanes M.D.   On: 08/02/2019 10:39   Xr Chest  Portable  Result Date: 08/30/2019 CLINICAL DATA:  Weakness, shakiness, hypoglycemia, atrial fibrillation with rapid ventricular response, heart rate in 160s EXAM: PORTABLE CHEST 1 VIEW COMPARISON:  Portable exam 0934 hours compared to 06/05/2019 FINDINGS: Enlargement of cardiac silhouette. Mediastinal contours and pulmonary vascularity normal. Lungs clear. No pulmonary infiltrate, pleural effusion, or pneumothorax. Bones appear demineralized. LEFT glenohumeral degenerative changes. Mild scattered endplate spur formation thoracic spine. IMPRESSION: No acute abnormalities. Enlargement of cardiac silhouette. Electronically Signed   By: Lavonia Dana M.D.   On: 08/30/2019 09:47    EKG EKG Interpretation  Date/Time:  Friday August 30 2019 09:21:50 EST Ventricular Rate:  116 PR Interval:    QRS Duration: 108 QT Interval:  354 QTC Calculation: 492 R Axis:   150 Text Interpretation: Atrial fibrillation Right axis deviation Borderline prolonged QT interval Nonspecific T wave abnormality Confirmed by Lajean Saver [phone]) on 08/30/2019 9:33:22 AM   Radiology Xr Chest Portable  Result Date: 08/30/2019 CLINICAL DATA:  Weakness, shakiness, hypoglycemia, atrial fibrillation with rapid ventricular response, heart rate in 160s EXAM: PORTABLE CHEST 1 VIEW COMPARISON:  Portable exam 0934 hours compared to 06/05/2019 FINDINGS: Enlargement of cardiac silhouette. Mediastinal contours and pulmonary vascularity normal. Lungs clear. No pulmonary infiltrate, pleural effusion, or pneumothorax. Bones appear demineralized. LEFT glenohumeral degenerative changes. Mild scattered endplate spur formation thoracic spine. IMPRESSION: No acute abnormalities. Enlargement of cardiac silhouette. Electronically Signed   By: Lavonia Dana M.D.   On: 08/30/2019 09:47    Procedures Procedures (including critical care time)  Medications Ordered in ED Medications  0.9 %  sodium chloride infusion (has no administration in time  range)     Initial Impression / Assessment and Plan / ED Course  I have reviewed the triage vital signs and the nursing notes.  Pertinent labs & imaging results that were available during my care of the patient were reviewed by me and considered in my medical decision making (see chart for details).  Iv ns. Continuous pulse ox and monitor. Ecg. Stat labs.   Reviewed nursing notes and prior charts for additional history.   Labs reviewed/interpreted by me - wbc normal. covid neg. k sl low.   Todd Bright was evaluated in Emergency Department on 08/30/2019 for the symptoms described in the history of present illness. He was evaluated in the context of the global COVID-19 pandemic, which necessitated consideration that the patient might be at risk for infection with the SARS-CoV-2 virus that causes COVID-19. Institutional protocols and algorithms that pertain to the evaluation of patients at risk for COVID-19 are in a state of rapid change based on information released by regulatory bodies including the CDC and federal and state organizations. These policies and algorithms were followed during the patient's care in the ED.  kcl po. Po fluids/food.  CXR reviewed/interpreted by me - no pna.  Report of fall a few days ago, ?head contusion, pt generally weak, poor appetite - will add ct head to workup.  Additional labs return, delta trop is normal/not significantly  increased from prior. Ca is low - oscal po. rec ca supplement.    CT reviewed/interpreted by me - no hem. ?enlarged ventricles - rec neurology f/u.  Recheck pt - pt indicates he is feeling improved, feeling fine and requests d/c to home. Patients vital signs are normal, he is afebrile, breathing comfortably, sats currently 96% room air. Patient denies pain, no faintness or dizziness.   Patient currently appears stable for d/c.   Rec close outpt pcp f/u. Will also make home health referral.  Return precautions provided.      Final Clinical Impressions(s) / ED Diagnoses   Final diagnoses:  None    ED Discharge Orders    None           Lajean Saver, MD 08/30/19 1340

## 2019-08-30 NOTE — ED Notes (Signed)
X-ray at bedside

## 2019-08-30 NOTE — ED Triage Notes (Signed)
Per [facility] EMS called out for weakness and shaky, pts initial CBG in 60s. Had patient eat oatmeal creme pie and mountain dew CBG up to 100. Patient also found to be in A-fib RVR in the 160s. Given 12.5 Cardizem and 1L NS bolus. Patient alert and orientated x 4.

## 2019-08-30 NOTE — ED Notes (Signed)
[facility] called @1347 -per Joellen Jersey, RN called by Levada Dy

## 2019-11-04 DEATH — deceased

## 2021-08-18 IMAGING — RF DG FEMUR 2+V*R*
1 series · 4 of 4 positions shown · non-contrast
Comparison: 06/05/2019

CLINICAL DATA: Right hip fracture

EXAM:
RIGHT FEMUR 2 VIEWS; DG C-ARM 1-60 MIN

[Series 1: run · 4 of 4 slices shown]
[im 1/4]
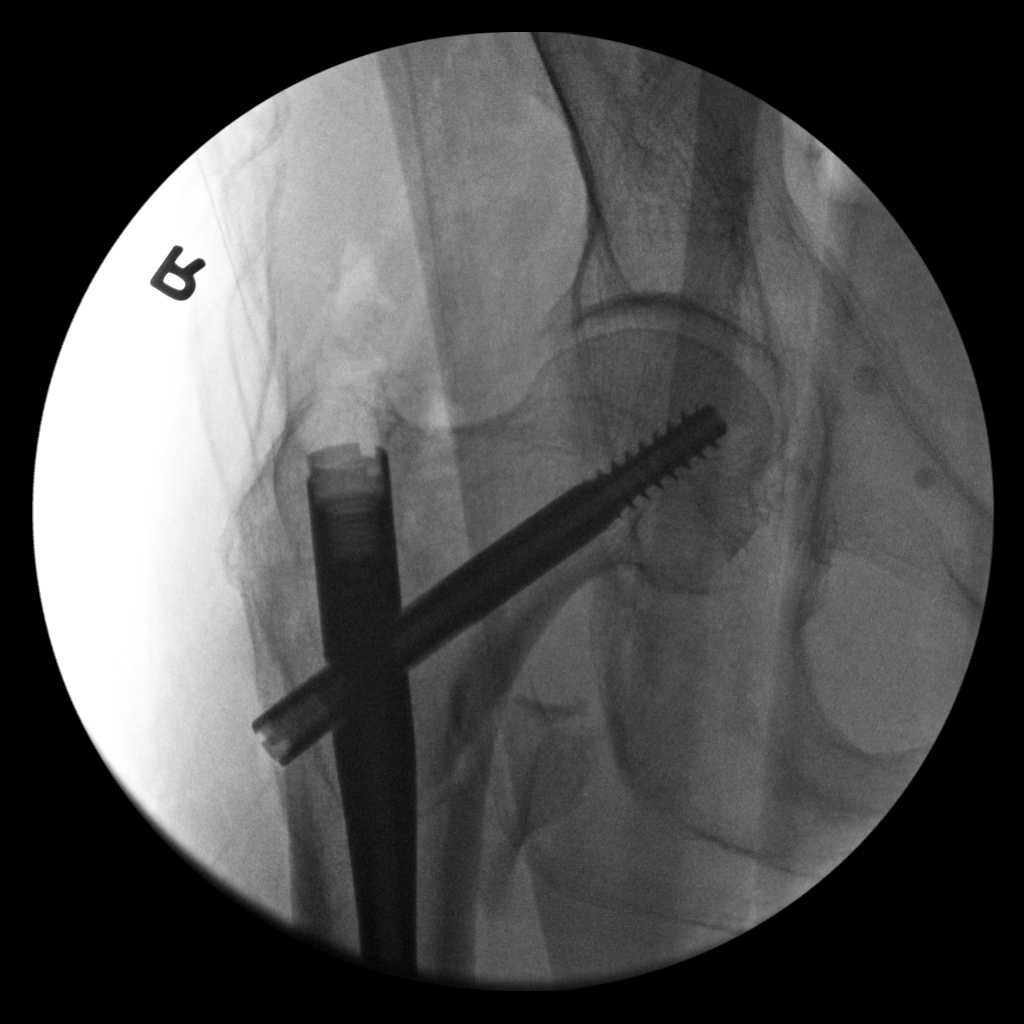
[im 2/4]
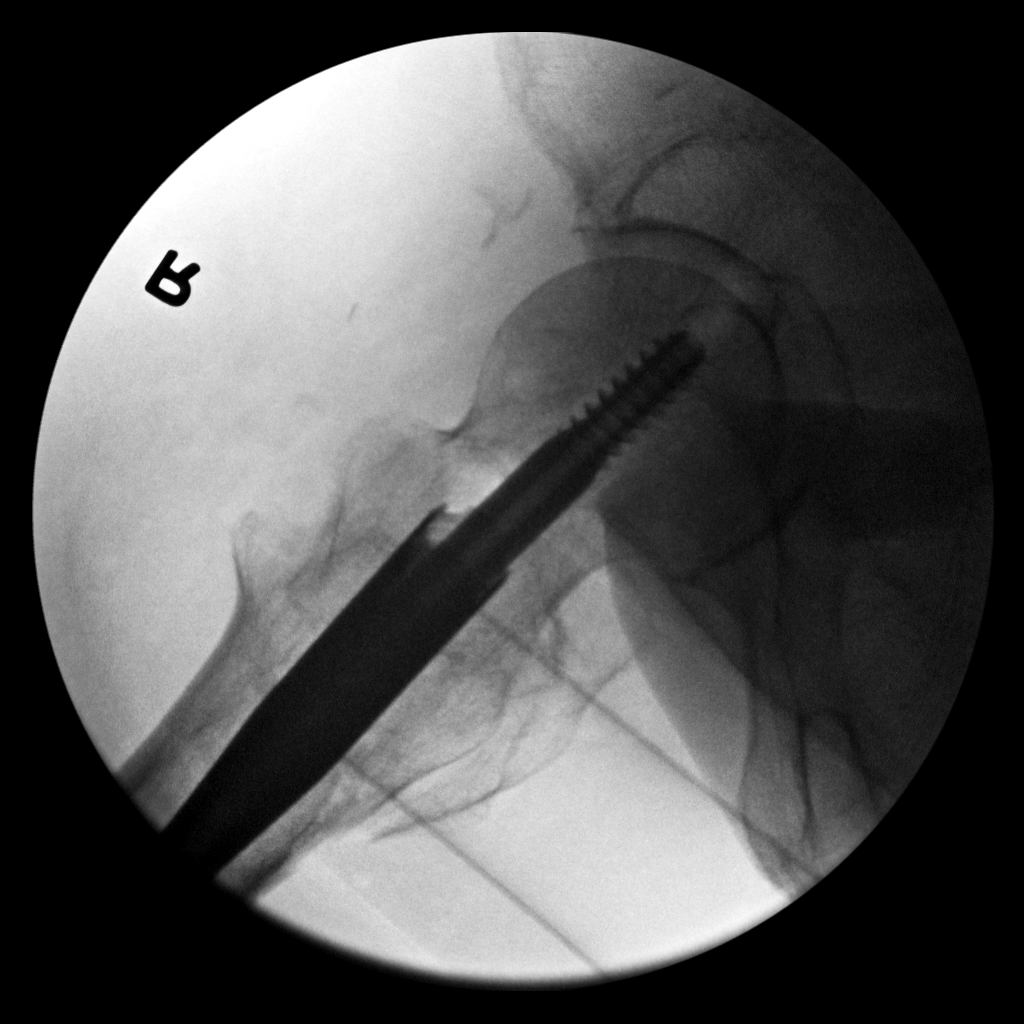
[im 3/4]
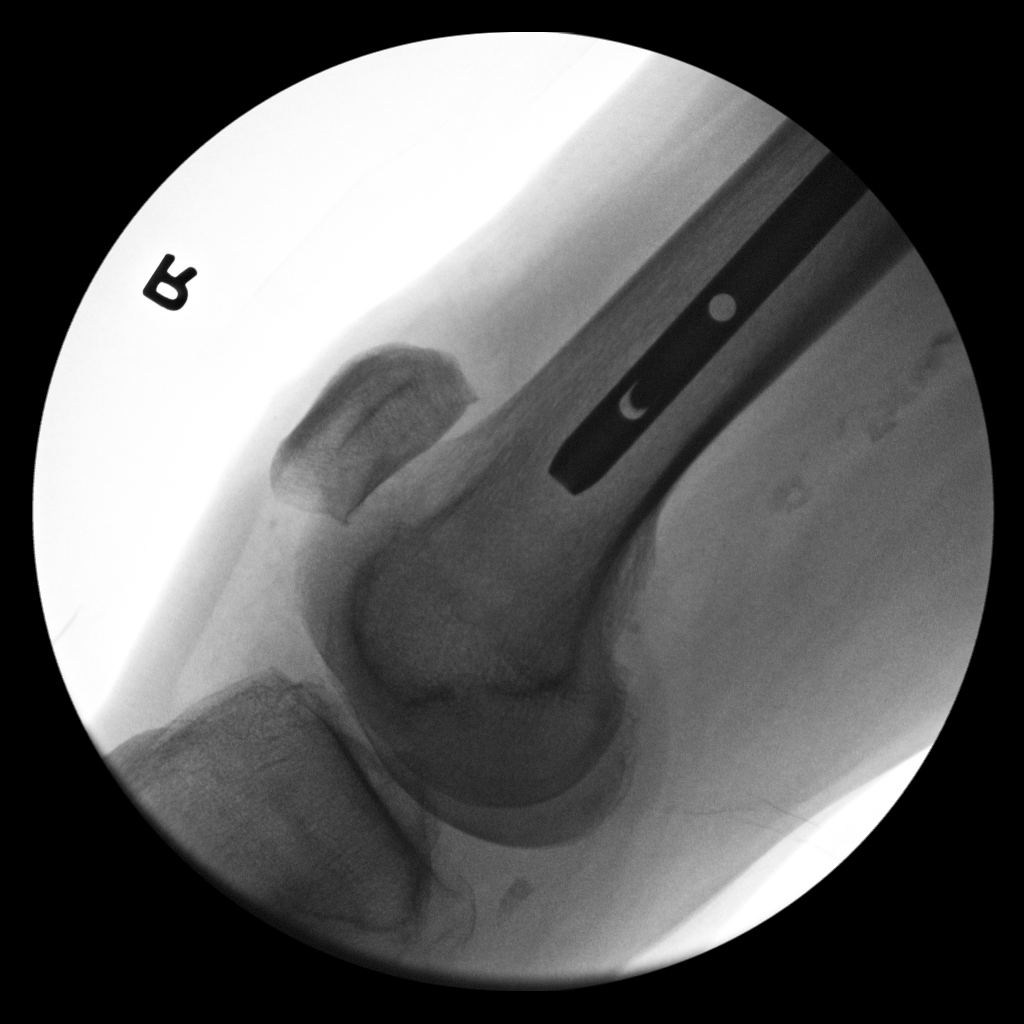
[im 4/4]
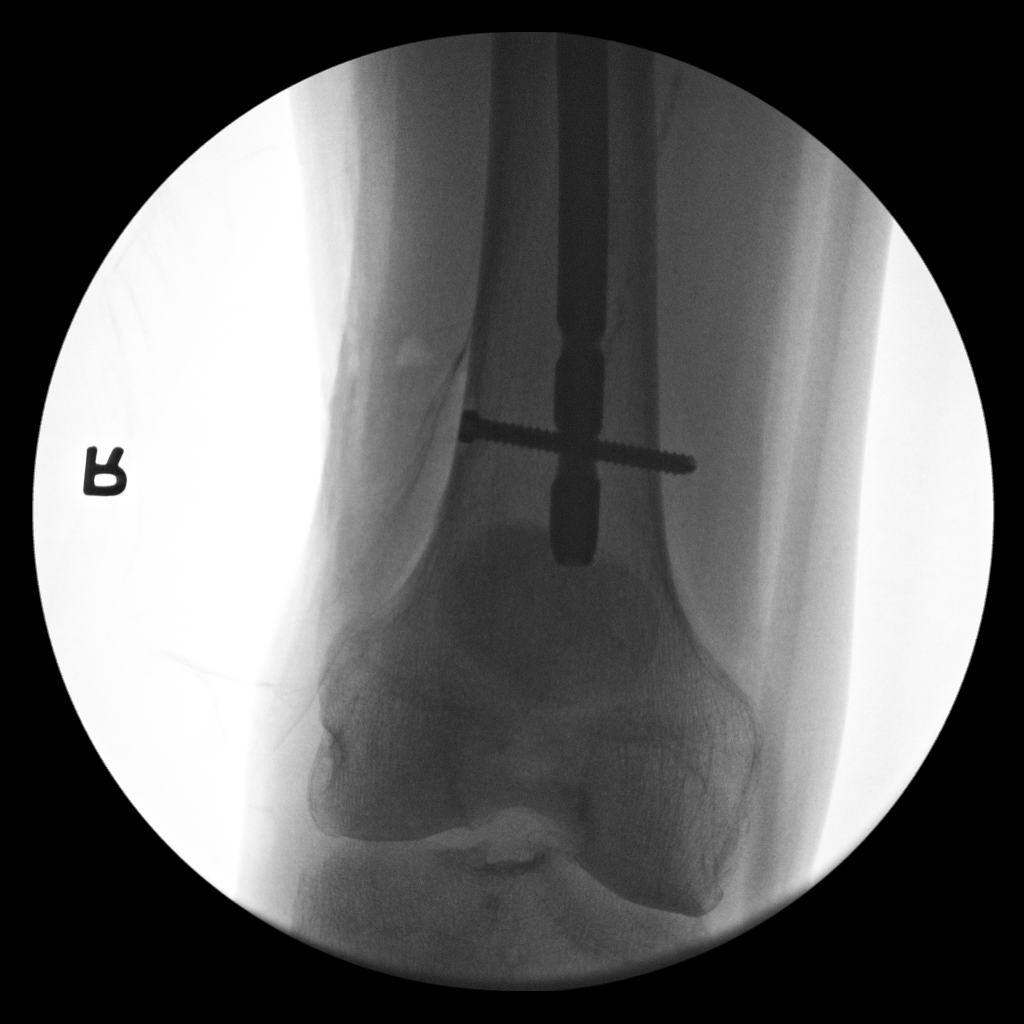

[4 of 4 positions shown; findings below may reference images not displayed]

FINDINGS: Four low resolution intraoperative spot views of the right femur.
Total fluoroscopy time was 50 seconds. The images demonstrate
intramedullary rod fixation of the right femur for comminuted
intertrochanteric fracture. Displaced lesser trochanteric fracture
fragment is noted.
IMPRESSION: Intraoperative fluoroscopic assistance provided during surgical
fixation of right intertrochanteric fracture

## 2021-10-14 IMAGING — CT CT L SPINE W/O CM
3 series · 10 of 33 positions shown, 11 images · non-contrast
Comparison: Lumbar radiographs 08/02/2019

CLINICAL DATA: Fell.  Injured back.

EXAM:
CT LUMBAR SPINE WITHOUT CONTRAST
TECHNIQUE: Multidetector CT imaging of the lumbar spine was performed without
intravenous contrast administration. Multiplanar CT image
reconstructions were also generated.

[Series 4: l-spine 2.0 st · axial · 0.36mm/px · z∈[+884,+1040]mm · 2 of 169 slices shown, 3 images]
[im 52/169  soft-tissue]
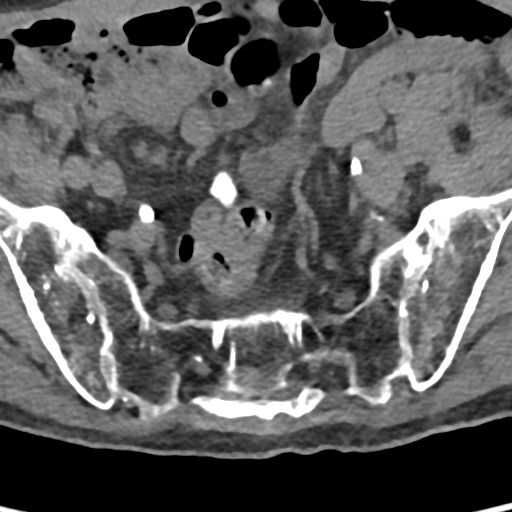
[im 52/169  bone]
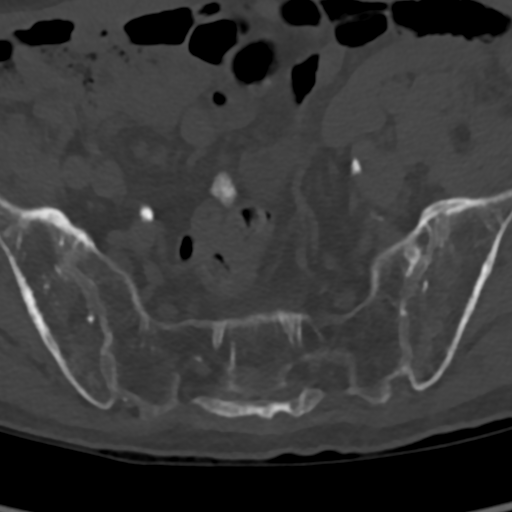
[im 130/169  bone]
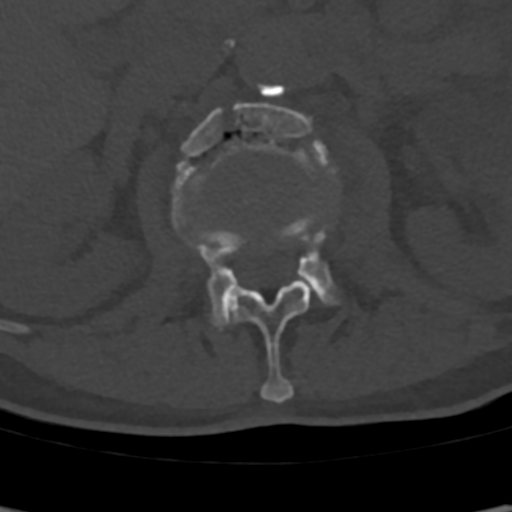

[Series 6: l-spine 2.0 cor bone · coronal · 0.38mm/px · 3 of 73 slices shown]
[im 15/73  bone]
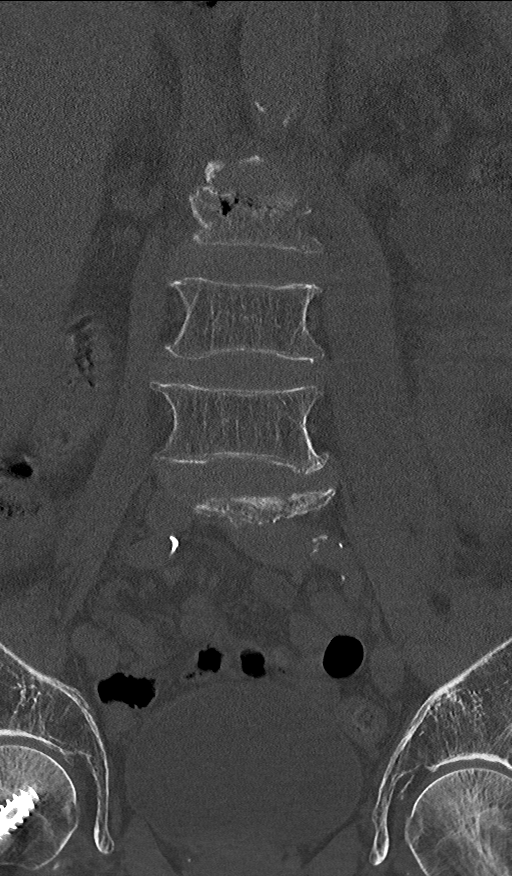
[im 29/73  bone]
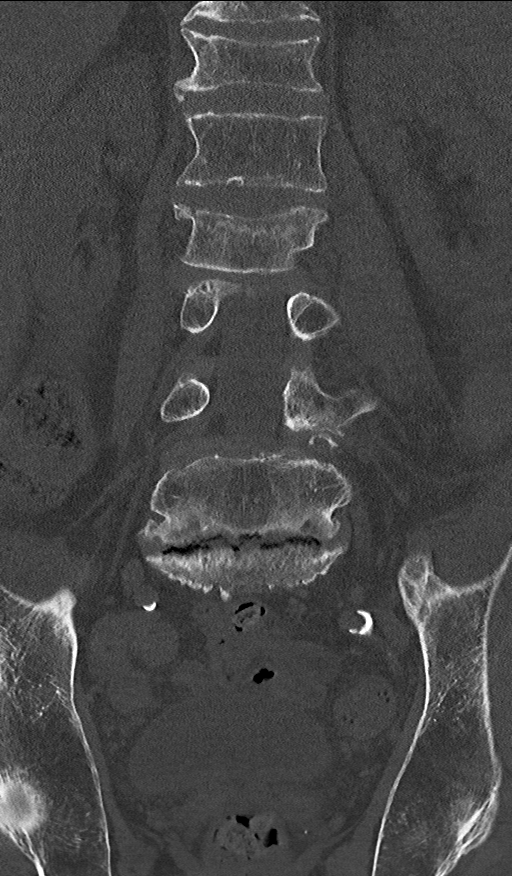
[im 44/73  bone]
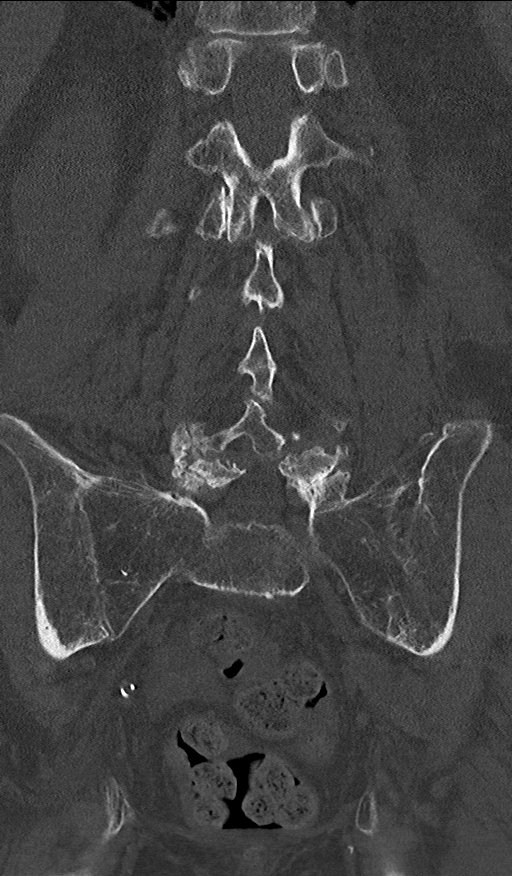

[Series 7: l-spine 2.0 sag bone · sagittal · 0.32mm/px · 5 of 76 slices shown]
[im 26/76  bone]
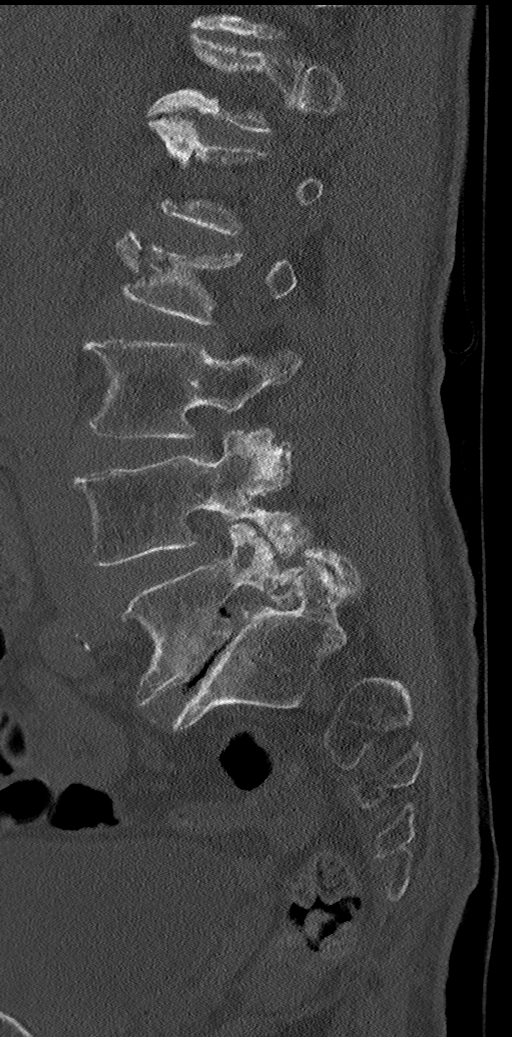
[im 32/76  bone]
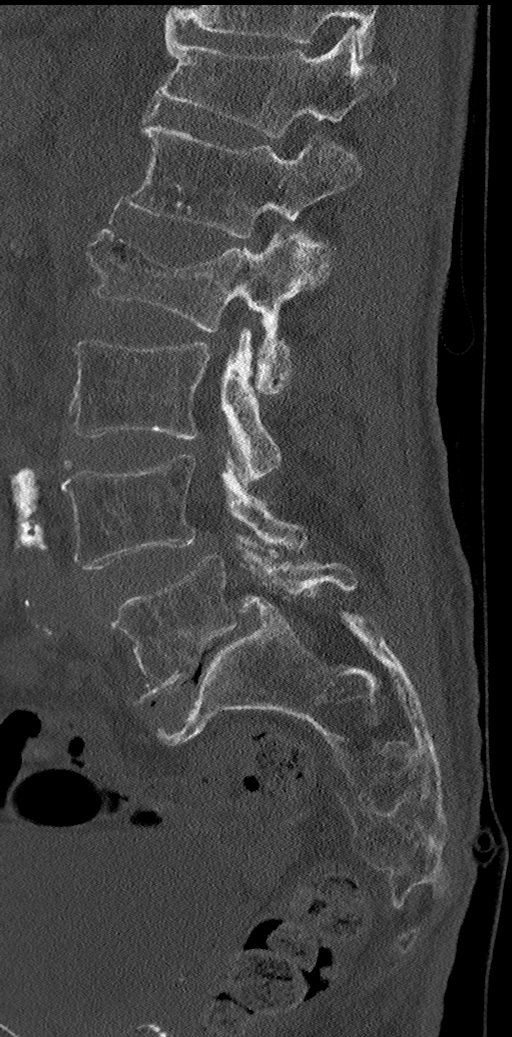
[im 38/76  bone]
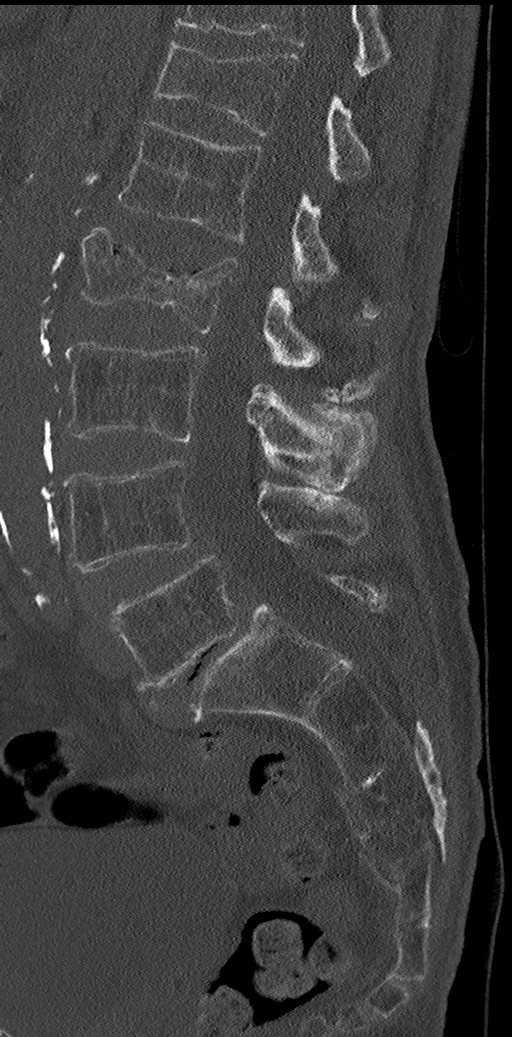
[im 44/76  bone]
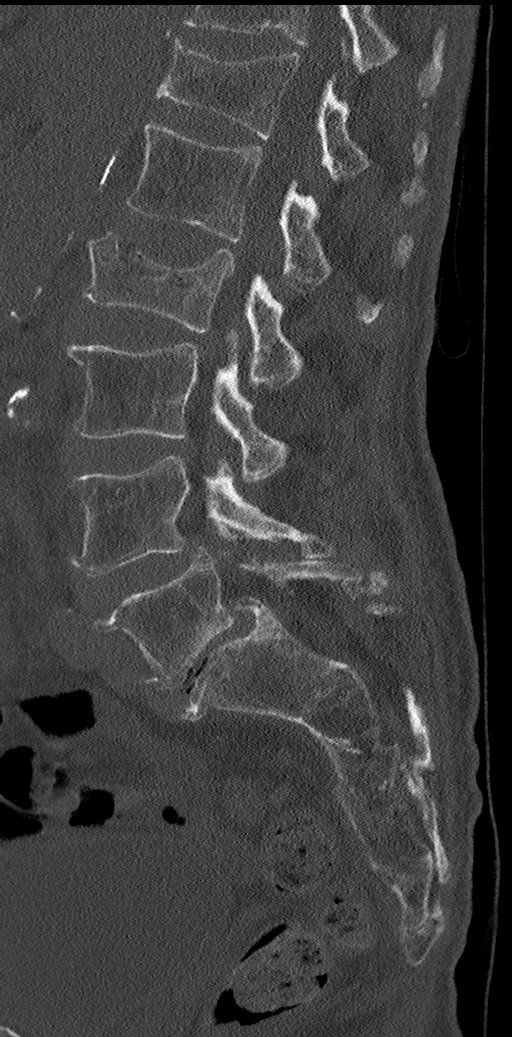
[im 51/76  bone]
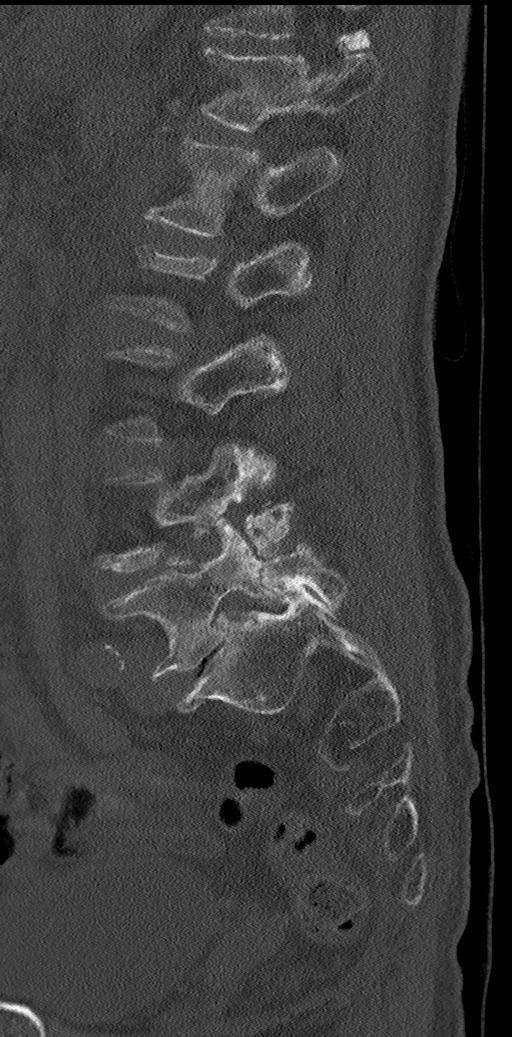

[10 of 33 positions shown; findings below may reference images not displayed]

FINDINGS: Segmentation: There are five lumbar type vertebral bodies. The last
full intervertebral disc space is labeled L5-S1.

Alignment: Bilateral pars defects at L5 with a grade 1
spondylolisthesis. There is also mild degenerative anterolisthesis
of L4. The other vertebral bodies are normally aligned.

Vertebrae: There is a remote appearing compression fracture of T12.
No retropulsion or canal compromise.

Acute compression fracture of L2 with approximately 60% central
compression of the superior endplate. There is mild retropulsion of
the posterosuperior aspect of vertebral body but no significant
canal stenosis. No involvement of the pedicles or posterior
elements. The other lumbar vertebral bodies are maintained. No acute
fracture. There is moderate to advanced osteoporosis noted.
Multilevel facet disease.

Paraspinal and other soft tissues: No significant paraspinal or
retroperitoneal findings. Small amount of paraspinal hematoma at L2.

Disc levels: T12-L1: No significant findings.

L1-2: Mild retropulsion involving the posterosuperior aspect of L2
with mild flattening of the ventral thecal sac but no significant
canal stenosis. No significant foraminal stenosis.

L2-3: Mild diffuse annular bulge with flattening of the ventral
thecal sac but no large disc protrusion, spinal or significant
foraminal stenosis.

L3-4: Bulging annulus and facet disease but no significant spinal or
foraminal stenosis.

L4-5: Postoperative changes with a right-sided laminectomy defect.
There is a bulging uncovered disc, facet disease and ligamentum
flavum thickening contributing to mild spinal and bilateral lateral
recess stenosis. There is also mild bilateral foraminal stenosis.

L5-S1: Bulging uncovered disc but the canal is generous and there is
no significant spinal stenosis. Severe facet disease. Mild to
moderate bilateral foraminal stenosis.
IMPRESSION: 1. Acute L2 compression fracture with mild retropulsion but no
significant canal compromise.
2. Remote T12 compression fracture.
3. Osteoporosis.
4. Bilateral pars defects at L5 with grade 1 spondylolisthesis and
associated severe disc disease and facet disease at L5-S1.
5. Mild multifactorial spinal and bilateral lateral recess stenosis
and bilateral foraminal stenosis at L4-5.
6. Mild to moderate bilateral foraminal stenosis at L5-S1.
